# Patient Record
Sex: Female | Born: 1975 | Race: Black or African American | Hispanic: No | Marital: Married | Smoking: Never smoker
Health system: Southern US, Community
[De-identification: ages and names within clinical notes are randomized; demographics above are authoritative.]

## PROBLEM LIST (undated history)

## (undated) DIAGNOSIS — F419 Anxiety disorder, unspecified: Secondary | ICD-10-CM

## (undated) DIAGNOSIS — K589 Irritable bowel syndrome without diarrhea: Secondary | ICD-10-CM

## (undated) DIAGNOSIS — Z8679 Personal history of other diseases of the circulatory system: Secondary | ICD-10-CM

## (undated) DIAGNOSIS — I509 Heart failure, unspecified: Secondary | ICD-10-CM

## (undated) DIAGNOSIS — E039 Hypothyroidism, unspecified: Secondary | ICD-10-CM

## (undated) DIAGNOSIS — E0590 Thyrotoxicosis, unspecified without thyrotoxic crisis or storm: Secondary | ICD-10-CM

## (undated) DIAGNOSIS — R87619 Unspecified abnormal cytological findings in specimens from cervix uteri: Secondary | ICD-10-CM

## (undated) DIAGNOSIS — D649 Anemia, unspecified: Secondary | ICD-10-CM

## (undated) DIAGNOSIS — IMO0002 Reserved for concepts with insufficient information to code with codable children: Secondary | ICD-10-CM

## (undated) DIAGNOSIS — D573 Sickle-cell trait: Secondary | ICD-10-CM

## (undated) HISTORY — PX: DILATION AND CURETTAGE OF UTERUS: SHX78

## (undated) HISTORY — DX: Heart failure, unspecified: I50.9

## (undated) HISTORY — DX: Anemia, unspecified: D64.9

## (undated) HISTORY — DX: Irritable bowel syndrome, unspecified: K58.9

## (undated) HISTORY — DX: Hypothyroidism, unspecified: E03.9

## (undated) HISTORY — DX: Unspecified abnormal cytological findings in specimens from cervix uteri: R87.619

## (undated) HISTORY — DX: Personal history of other diseases of the circulatory system: Z86.79

## (undated) HISTORY — DX: Thyrotoxicosis, unspecified without thyrotoxic crisis or storm: E05.90

## (undated) HISTORY — DX: Anxiety disorder, unspecified: F41.9

## (undated) HISTORY — DX: Sickle-cell trait: D57.3

## (undated) HISTORY — DX: Reserved for concepts with insufficient information to code with codable children: IMO0002

## (undated) HISTORY — PX: GYNECOLOGIC CRYOSURGERY: SHX857

---

## 1998-09-04 ENCOUNTER — Ambulatory Visit (HOSPITAL_COMMUNITY): Admission: RE | Admit: 1998-09-04 | Discharge: 1998-09-04 | Payer: Self-pay | Admitting: Obstetrics and Gynecology

## 1998-09-04 ENCOUNTER — Encounter: Payer: Self-pay | Admitting: Obstetrics and Gynecology

## 1999-01-21 ENCOUNTER — Inpatient Hospital Stay (HOSPITAL_COMMUNITY): Admission: AD | Admit: 1999-01-21 | Discharge: 1999-01-23 | Payer: Self-pay | Admitting: *Deleted

## 1999-02-27 ENCOUNTER — Encounter: Admission: RE | Admit: 1999-02-27 | Discharge: 1999-05-28 | Payer: Self-pay | Admitting: Obstetrics and Gynecology

## 1999-06-27 ENCOUNTER — Encounter: Admission: RE | Admit: 1999-06-27 | Discharge: 1999-09-25 | Payer: Self-pay | Admitting: Obstetrics and Gynecology

## 1999-09-27 ENCOUNTER — Encounter: Admission: RE | Admit: 1999-09-27 | Discharge: 1999-12-26 | Payer: Self-pay | Admitting: Obstetrics and Gynecology

## 1999-12-27 ENCOUNTER — Encounter: Admission: RE | Admit: 1999-12-27 | Discharge: 2000-03-26 | Payer: Self-pay | Admitting: Obstetrics and Gynecology

## 2000-04-26 ENCOUNTER — Encounter: Admission: RE | Admit: 2000-04-26 | Discharge: 2000-05-26 | Payer: Self-pay | Admitting: Obstetrics and Gynecology

## 2000-06-26 ENCOUNTER — Encounter: Admission: RE | Admit: 2000-06-26 | Discharge: 2000-07-26 | Payer: Self-pay | Admitting: Obstetrics and Gynecology

## 2000-07-26 ENCOUNTER — Other Ambulatory Visit: Admission: RE | Admit: 2000-07-26 | Discharge: 2000-07-26 | Payer: Self-pay | Admitting: Obstetrics and Gynecology

## 2000-08-26 ENCOUNTER — Encounter: Admission: RE | Admit: 2000-08-26 | Discharge: 2000-09-25 | Payer: Self-pay | Admitting: Obstetrics and Gynecology

## 2001-07-27 ENCOUNTER — Other Ambulatory Visit: Admission: RE | Admit: 2001-07-27 | Discharge: 2001-07-27 | Payer: Self-pay | Admitting: Obstetrics and Gynecology

## 2002-08-06 ENCOUNTER — Other Ambulatory Visit: Admission: RE | Admit: 2002-08-06 | Discharge: 2002-08-06 | Payer: Self-pay | Admitting: Obstetrics and Gynecology

## 2004-03-20 ENCOUNTER — Other Ambulatory Visit: Admission: RE | Admit: 2004-03-20 | Discharge: 2004-03-20 | Payer: Self-pay | Admitting: Obstetrics and Gynecology

## 2008-06-19 ENCOUNTER — Ambulatory Visit (HOSPITAL_COMMUNITY): Admission: RE | Admit: 2008-06-19 | Discharge: 2008-06-19 | Payer: Self-pay | Admitting: Obstetrics and Gynecology

## 2008-06-19 ENCOUNTER — Encounter (INDEPENDENT_AMBULATORY_CARE_PROVIDER_SITE_OTHER): Payer: Self-pay | Admitting: Obstetrics and Gynecology

## 2008-07-09 IMAGING — CT CT-UROGRAM
2 series · 16 of 42 positions shown, 19 images · non-contrast
Comparison: NONE

CLINICAL DATA: Hematuria.  Right-sided pain.  Evaluate for 
kidney  stones. 

CT UROGRAM
TECHNIQUE: Thin-section unenhanced axial images were obtained to 
provide a CT urogram to evaluate for possible urinary tract stone. 
 Scan thicknesses were 3 mm with 3-mm increments.

[Series 2: wo · axial · 0.68mm/px · z∈[+1295,+1643]mm · 13 of 130 slices shown, 16 images]
[im 9/130  soft-tissue]
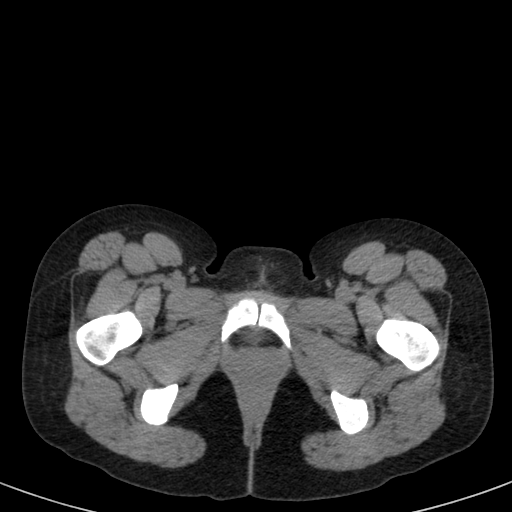
[im 9/130  bone]
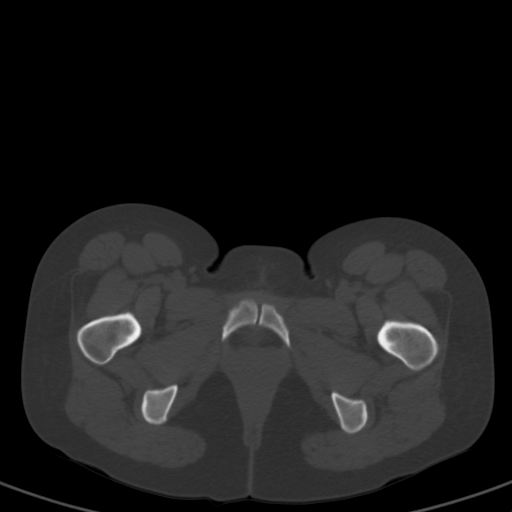
[im 21/130  soft-tissue]
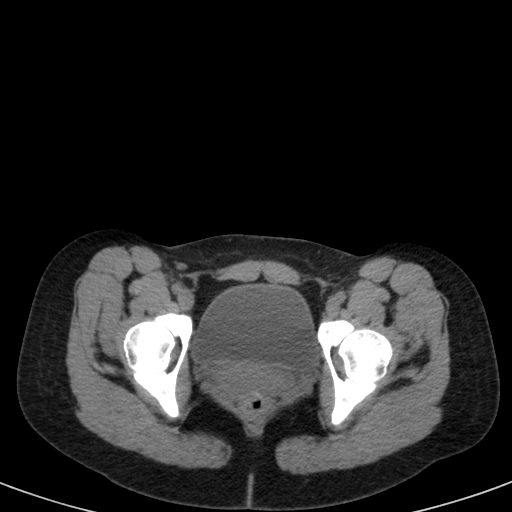
[im 34/130  soft-tissue]
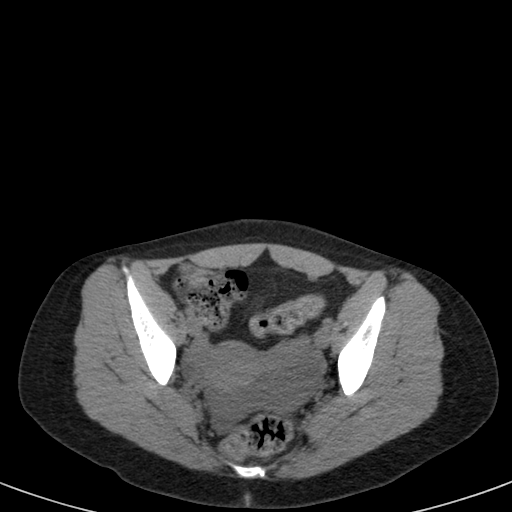
[im 46/130  soft-tissue]
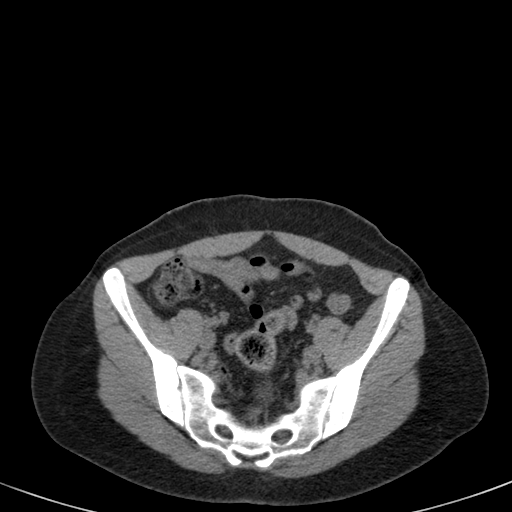
[im 59/130  soft-tissue]
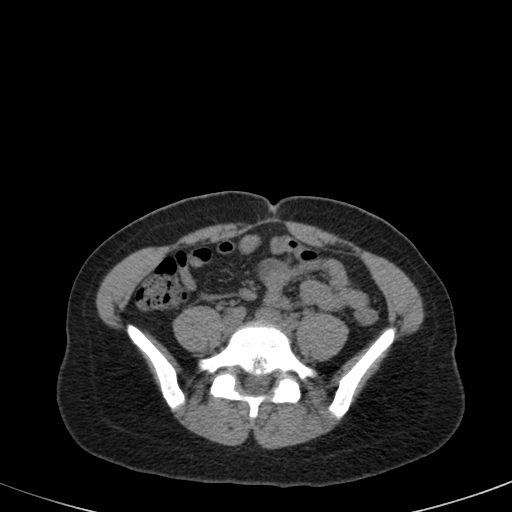
[im 71/130  soft-tissue]
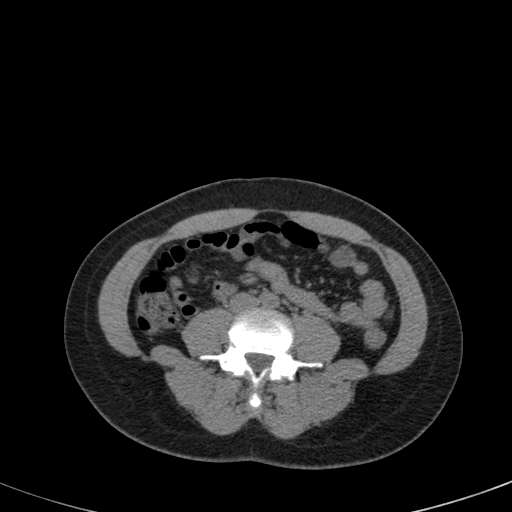
[im 84/130  soft-tissue]
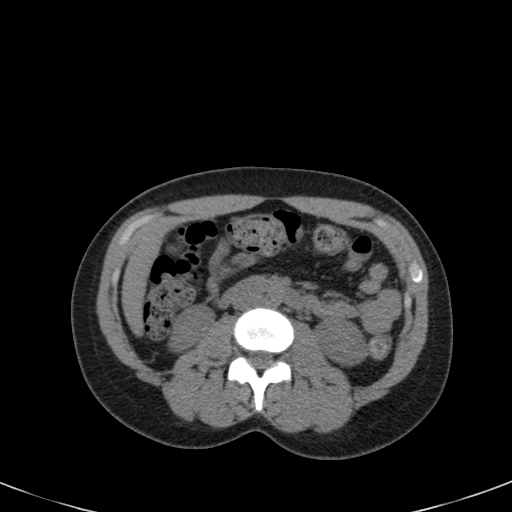
[im 96/130  soft-tissue]
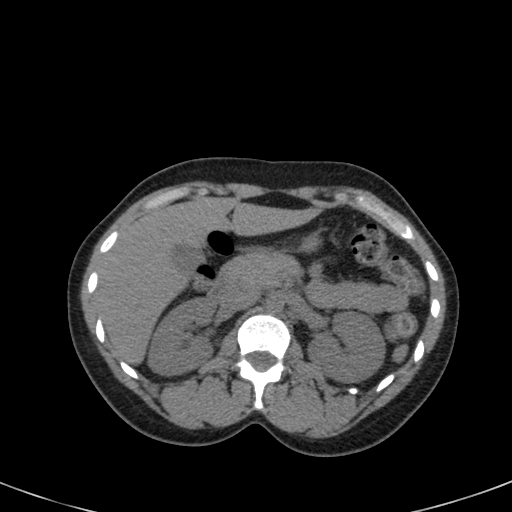
[im 109/130  soft-tissue]
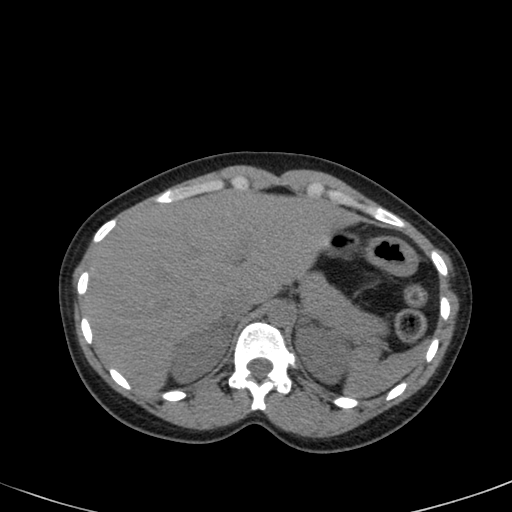
[im 109/130  bone]
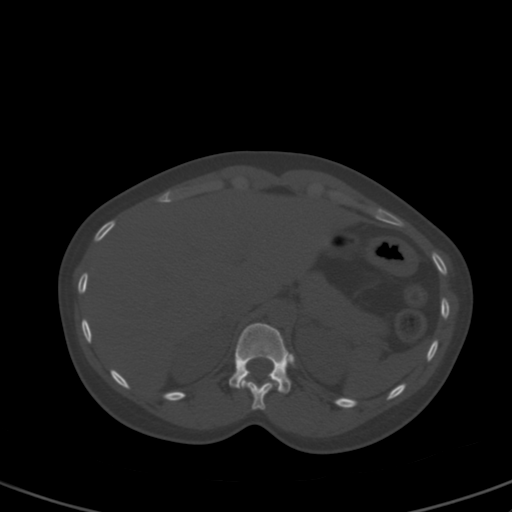
[im 113/130  lung]
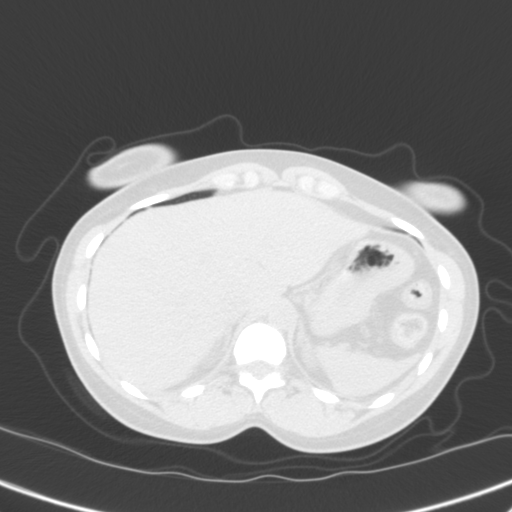
[im 117/130  lung]
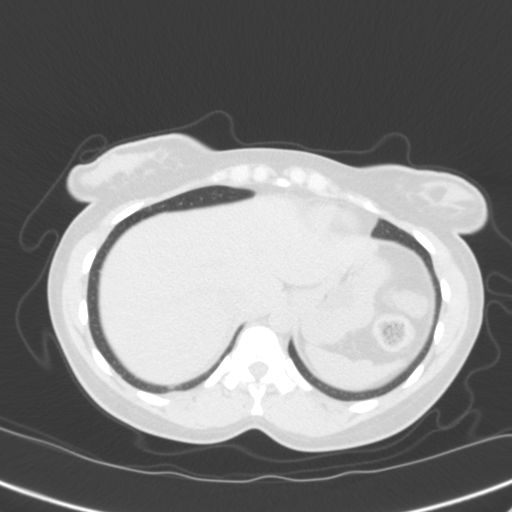
[im 121/130  soft-tissue]
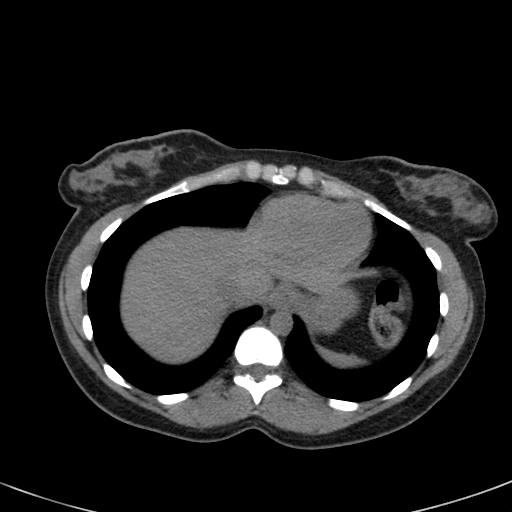
[im 121/130  lung]
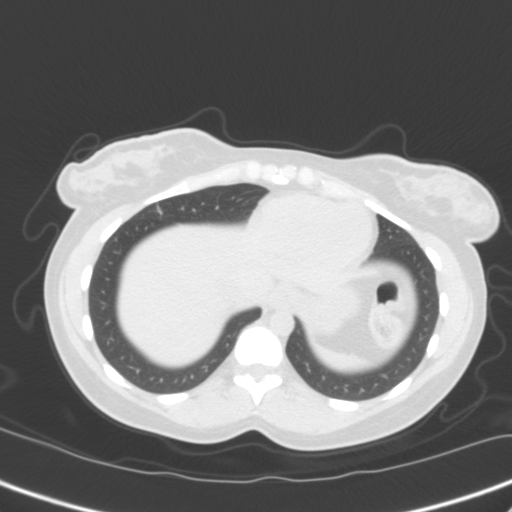
[im 125/130  lung]
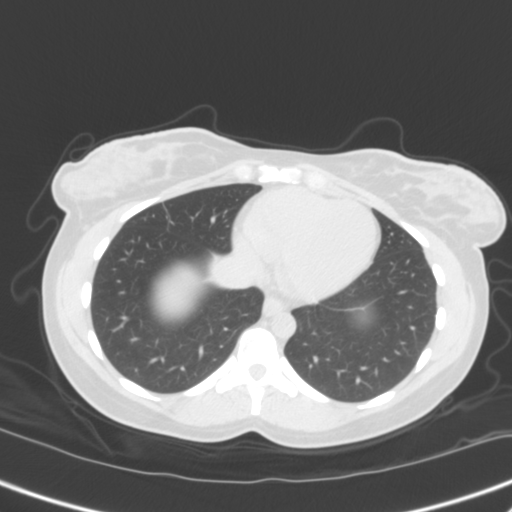

[coronals · coronal · 0.75mm/px · 3 of 72 slices shown]
[im 24/72  soft-tissue]
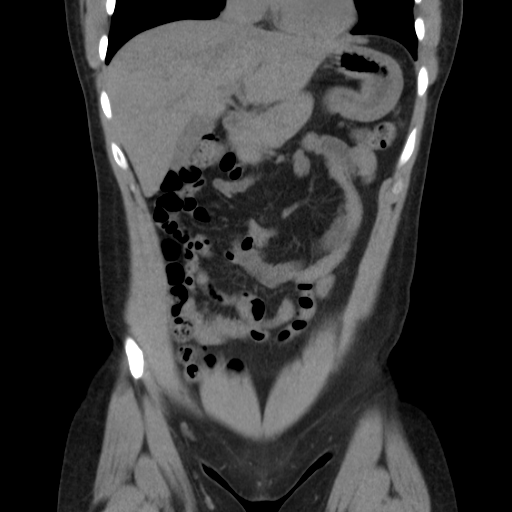
[im 32/72  soft-tissue]
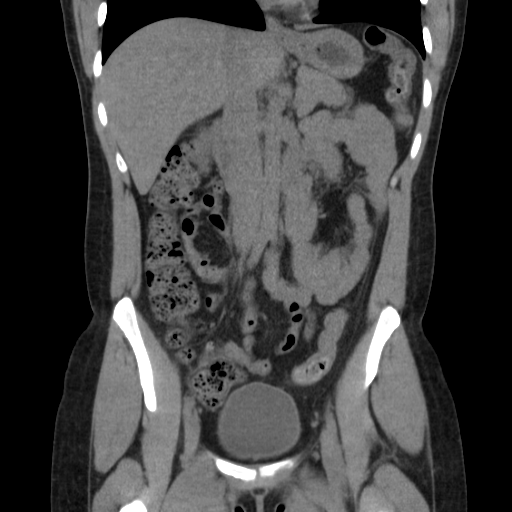
[im 40/72  soft-tissue]
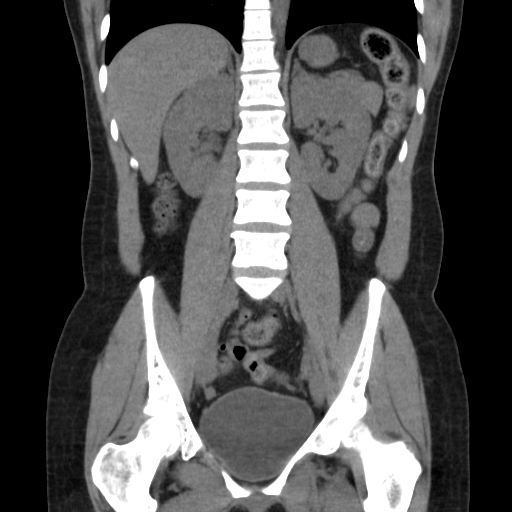

[16 of 42 positions shown; findings below may reference images not displayed]

FINDINGS: The study demonstrates that there are several 
calcifications seen in the right hemipelvis.  The inferior-most 
1-mm calcification seen on image [DATE] represent a distal right 
ureteral stone or may represent an adnexal calcification or 
phlebolith.  No dilation of the right ureter or pelvicaliceal 
system is currently evident.  No kidney stone is seen on the left. 
 No dilation is seen on the left.  No stone is seen along the 
course of the left ureter. In the pelvis, there is noted to be 
some peritoneal fluid or cul-de-sac fluid.  There are also rounded 
fluid-type densities measuring 2.0 to 3.0 cm in size in the right 
and left hemipelvis, which may represent cysts in association with 
the right and left ovaries  These are in the expected location of 
the ovaries given the position of the uterus just to the right of 
midline.  Uterus appears to be normal in size. Cecum is normal in 
appearance.  Appendix is normal in appearance containing air 
within the lumen. The liver, spleen, gallbladder, pancreas, 
kidneys, and aorta are unremarkable.  No focal bowel wall 
thickening is seen.
IMPRESSION: Although no dilation is seen in the right 
pelvicaliceal system or ureter, I cannot exclude the possibility 
of a nonobstructing or low-grade partially obstructing distal 
right ureteral stone measuring approximately 1.0 mm in size in the 
distal right ureter.  This cannot be differentiated from a 
phlebolith given its position. A small amount of fluid is seen in 
the cul-de-sac. There are likely to be bilateral ovarian cysts 
versus hydrosalpinx or pyosalpinx.  Consider evaluation of the 
pelvis with pelvic ultrasound for better delineation of the 
adnexal findings lying adjacent to a normal-appearing uterus. 
12/21/2005  Trans Date: 12/21/2005 [REDACTED]

## 2008-07-10 IMAGING — US US PELVIS COMPLETE
1 series · 13 of 25 positions shown · non-contrast
Comparison: NONE

CLINICAL DATA: Follow-up CT dated 12/21/05 

PELVIC ULTRASOUND

[Series 1: us pelvic · 0.28mm/px · 13 of 50 slices shown]
[im 1/50]
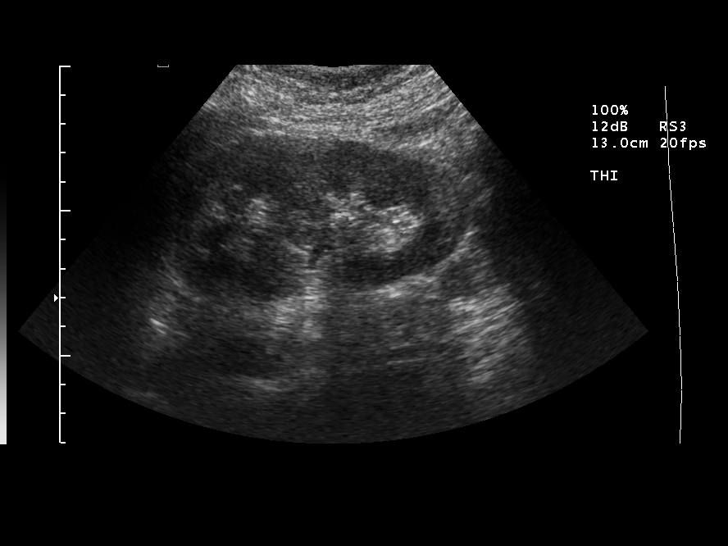
[im 5/50]
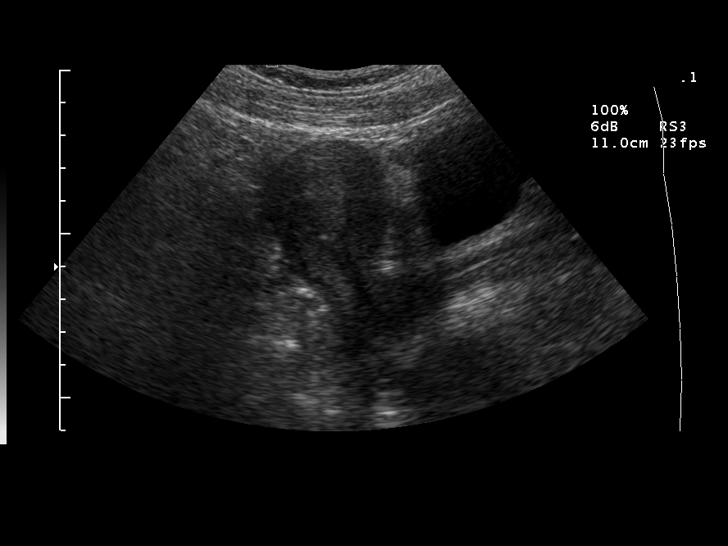
[im 9/50]
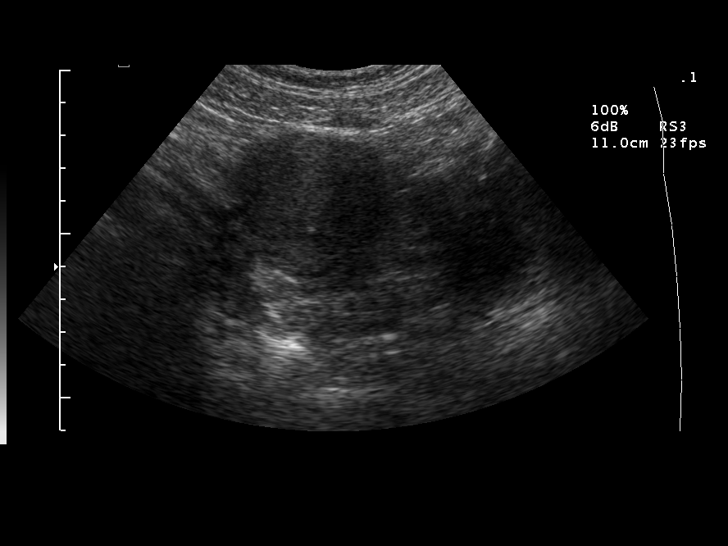
[im 13/50]
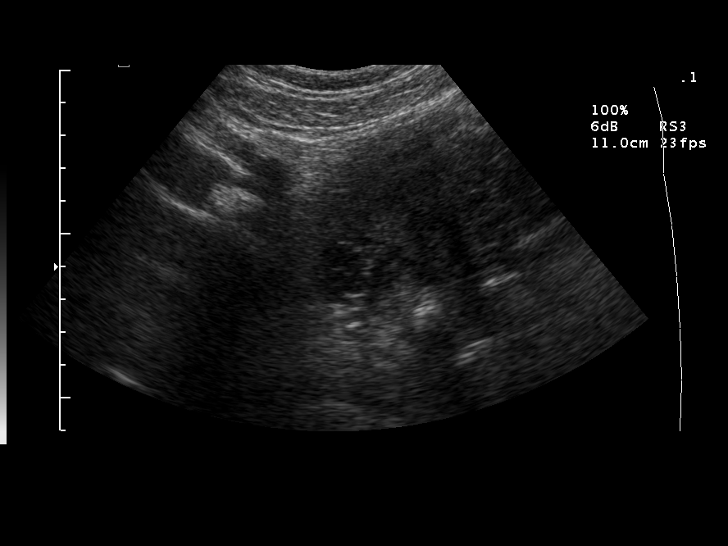
[im 17/50]
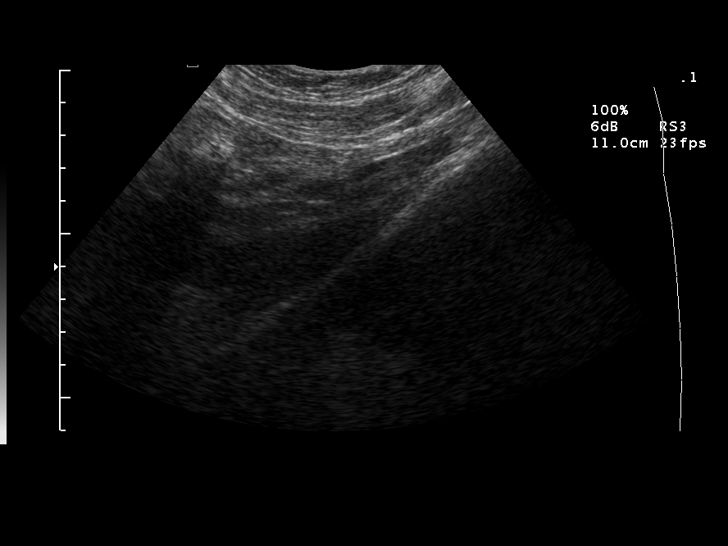
[im 21/50]
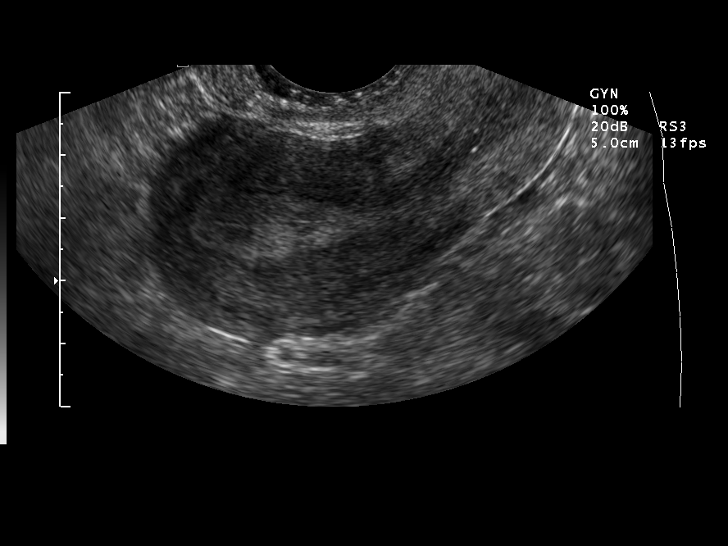
[im 25/50]
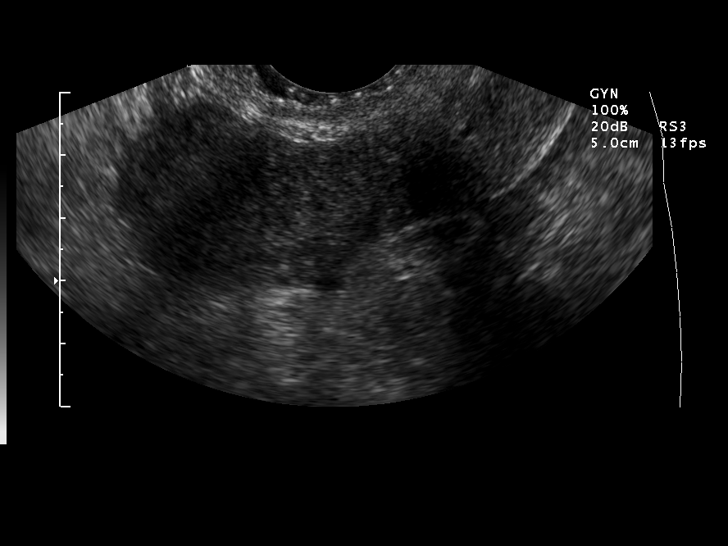
[im 29/50]
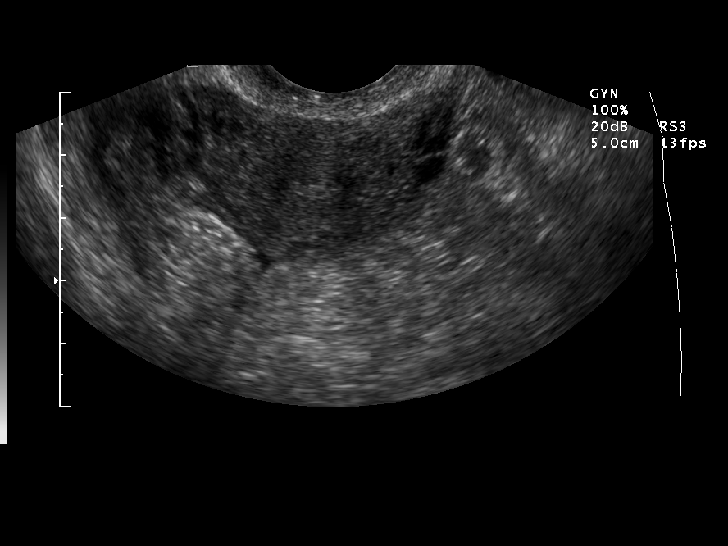
[im 33/50]
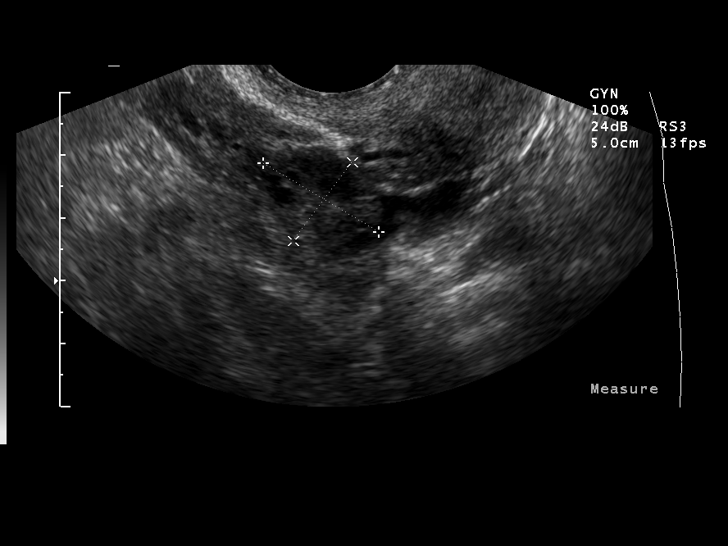
[im 37/50]
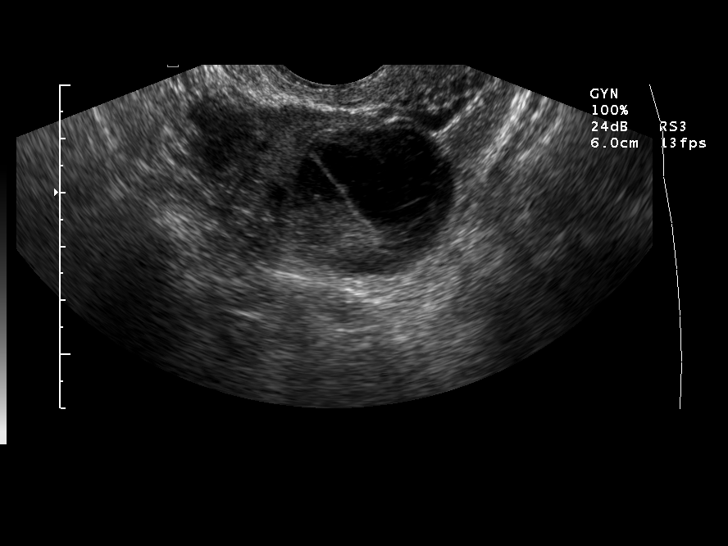
[im 41/50]
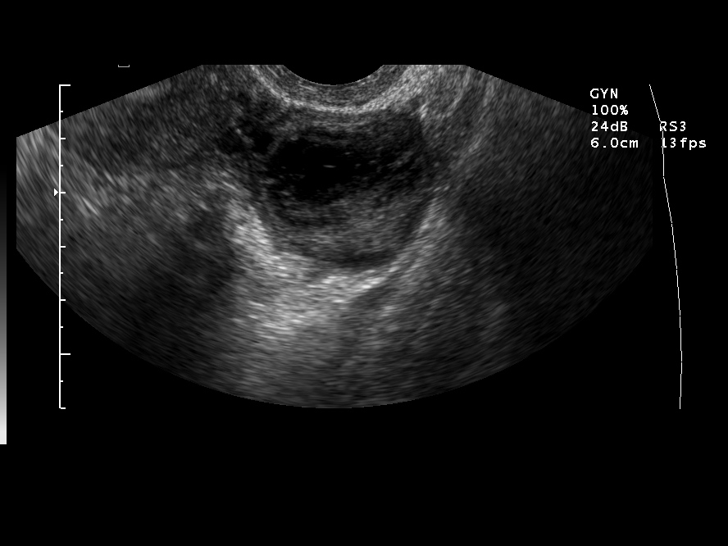
[im 45/50]
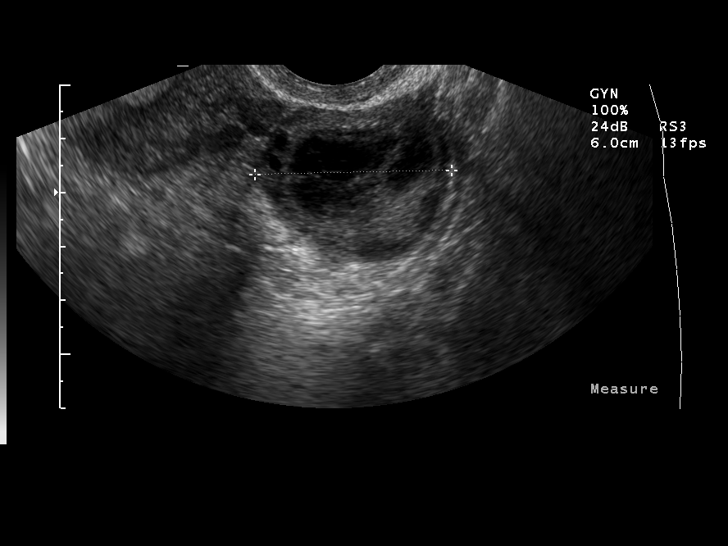
[im 50/50]
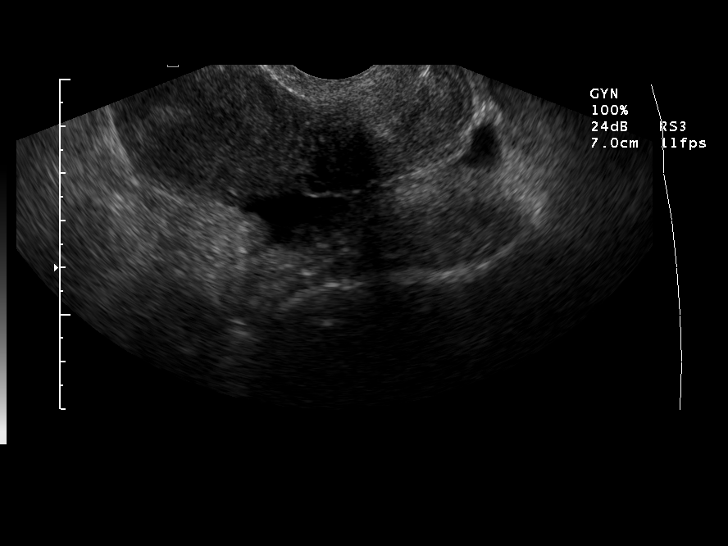

[13 of 25 positions shown; findings below may reference images not displayed]

FINDINGS: Recent CT was reviewed. This demonstrated possible 
bilateral adnexal cysts. Transabdominal and transvaginal images 
were performed. The kidneys are normal in size and echo appearance 
bilaterally. The uterus is normal in size and echo appearance with 
sagittal, AP and transverse dimensions of 7.0 x 4.0 x 4.8 cm. The 
endometrial stripe is normal in thickness and echo appearance 
measuring 1.1 cm. The right ovary is normal in size and echo 
appearance measuring 2.1 x 2.1 x 1.6 cm. The left ovary measures 
5.0 x 3.1 x 3.7 cm. Within the left ovary is a complex cystic and 
solid nodule measuring 3.0 cm. Appearances favor hemorrhagic cyst. 
A small amount of fluid is demonstrated within the cul-de-sac. 
There are no dilated tubular structures present.
IMPRESSION: 3 cm complex cyst in the left ovary with appearances 
favoring hemorrhagic cyst. To exclude the possibility of a 
neoplastic process, I recommend follow-up ultrasound in 4-6 weeks. 
Normal appearances of the right ovary. Small amount of fluid noted 
on 12/24/2005 Dict Date: 12/24/2005  Trans Date: 12/24/2005 CAV 
[REDACTED]

## 2009-03-31 ENCOUNTER — Ambulatory Visit (HOSPITAL_COMMUNITY): Admission: RE | Admit: 2009-03-31 | Discharge: 2009-03-31 | Payer: Self-pay | Admitting: Obstetrics and Gynecology

## 2009-05-23 ENCOUNTER — Inpatient Hospital Stay (HOSPITAL_COMMUNITY)
Admission: AD | Admit: 2009-05-23 | Discharge: 2009-06-18 | Payer: Self-pay | Source: Home / Self Care | Admitting: Obstetrics and Gynecology

## 2009-05-23 ENCOUNTER — Ambulatory Visit: Payer: Self-pay | Admitting: Internal Medicine

## 2009-05-23 ENCOUNTER — Encounter: Payer: Self-pay | Admitting: Obstetrics and Gynecology

## 2009-05-26 ENCOUNTER — Encounter: Payer: Self-pay | Admitting: Obstetrics and Gynecology

## 2009-05-27 ENCOUNTER — Encounter: Payer: Self-pay | Admitting: Obstetrics and Gynecology

## 2009-05-29 ENCOUNTER — Encounter: Payer: Self-pay | Admitting: Obstetrics and Gynecology

## 2009-05-30 ENCOUNTER — Encounter: Payer: Self-pay | Admitting: Obstetrics and Gynecology

## 2009-06-03 ENCOUNTER — Encounter: Payer: Self-pay | Admitting: Obstetrics and Gynecology

## 2009-06-04 ENCOUNTER — Encounter: Payer: Self-pay | Admitting: Obstetrics and Gynecology

## 2009-06-05 ENCOUNTER — Encounter: Payer: Self-pay | Admitting: Obstetrics and Gynecology

## 2009-06-06 ENCOUNTER — Encounter: Payer: Self-pay | Admitting: Obstetrics and Gynecology

## 2009-06-11 ENCOUNTER — Encounter: Payer: Self-pay | Admitting: Obstetrics and Gynecology

## 2009-06-12 ENCOUNTER — Encounter: Payer: Self-pay | Admitting: Obstetrics and Gynecology

## 2009-06-13 ENCOUNTER — Encounter: Payer: Self-pay | Admitting: Obstetrics and Gynecology

## 2009-06-15 ENCOUNTER — Encounter: Payer: Self-pay | Admitting: Internal Medicine

## 2009-06-18 ENCOUNTER — Encounter: Admission: RE | Admit: 2009-06-18 | Discharge: 2009-07-18 | Payer: Self-pay | Admitting: Obstetrics and Gynecology

## 2009-06-25 ENCOUNTER — Ambulatory Visit (HOSPITAL_COMMUNITY): Admission: AD | Admit: 2009-06-25 | Discharge: 2009-06-25 | Payer: Self-pay | Admitting: Neonatology

## 2009-07-19 ENCOUNTER — Encounter: Admission: RE | Admit: 2009-07-19 | Discharge: 2009-08-18 | Payer: Self-pay | Admitting: Obstetrics and Gynecology

## 2009-08-19 ENCOUNTER — Encounter: Admission: RE | Admit: 2009-08-19 | Discharge: 2009-09-18 | Payer: Self-pay | Admitting: Obstetrics and Gynecology

## 2009-09-19 ENCOUNTER — Encounter: Admission: RE | Admit: 2009-09-19 | Discharge: 2009-09-24 | Payer: Self-pay | Admitting: Obstetrics and Gynecology

## 2009-10-20 ENCOUNTER — Encounter: Admission: RE | Admit: 2009-10-20 | Discharge: 2009-11-19 | Payer: Self-pay | Admitting: Obstetrics and Gynecology

## 2009-11-20 ENCOUNTER — Encounter
Admission: RE | Admit: 2009-11-20 | Discharge: 2009-12-20 | Payer: Self-pay | Source: Home / Self Care | Attending: Obstetrics and Gynecology | Admitting: Obstetrics and Gynecology

## 2009-12-21 ENCOUNTER — Encounter
Admission: RE | Admit: 2009-12-21 | Discharge: 2010-01-20 | Payer: Self-pay | Source: Home / Self Care | Attending: Obstetrics and Gynecology | Admitting: Obstetrics and Gynecology

## 2010-01-21 ENCOUNTER — Encounter
Admission: RE | Admit: 2010-01-21 | Discharge: 2010-02-03 | Payer: Self-pay | Source: Home / Self Care | Attending: Obstetrics and Gynecology | Admitting: Obstetrics and Gynecology

## 2010-01-25 ENCOUNTER — Encounter: Payer: Self-pay | Admitting: Obstetrics and Gynecology

## 2010-01-25 ENCOUNTER — Encounter: Payer: Self-pay | Admitting: *Deleted

## 2010-01-28 ENCOUNTER — Encounter (HOSPITAL_COMMUNITY)
Admission: RE | Admit: 2010-01-28 | Discharge: 2010-02-03 | Payer: Self-pay | Source: Home / Self Care | Attending: Endocrinology | Admitting: Endocrinology

## 2010-02-11 ENCOUNTER — Other Ambulatory Visit: Payer: Self-pay | Admitting: Endocrinology

## 2010-02-11 DIAGNOSIS — E0590 Thyrotoxicosis, unspecified without thyrotoxic crisis or storm: Secondary | ICD-10-CM

## 2010-02-21 ENCOUNTER — Encounter (HOSPITAL_COMMUNITY)
Admission: RE | Admit: 2010-02-21 | Discharge: 2010-02-21 | Disposition: A | Discharge status: Home / Self Care | Payer: Managed Care, Other (non HMO) | Source: Ambulatory Visit | Attending: Obstetrics and Gynecology | Admitting: Obstetrics and Gynecology

## 2010-02-21 DIAGNOSIS — O923 Agalactia: Secondary | ICD-10-CM | POA: Insufficient documentation

## 2010-02-23 ENCOUNTER — Ambulatory Visit (HOSPITAL_COMMUNITY)
Admission: RE | Admit: 2010-02-23 | Discharge: 2010-02-23 | Disposition: A | Discharge status: Home / Self Care | Source: Ambulatory Visit | Attending: Endocrinology | Admitting: Endocrinology

## 2010-02-23 DIAGNOSIS — E0590 Thyrotoxicosis, unspecified without thyrotoxic crisis or storm: Secondary | ICD-10-CM | POA: Insufficient documentation

## 2010-02-23 LAB — HCG, SERUM, QUALITATIVE: Preg, Serum: NEGATIVE

## 2010-02-23 MED ORDER — SODIUM IODIDE I 131 CAPSULE
43.5000 | Freq: Once | INTRAVENOUS | Status: AC | PRN
  Administered 2010-02-23: 43.5 via ORAL

## 2010-03-11 ENCOUNTER — Emergency Department (HOSPITAL_COMMUNITY)
Admission: EM | Admit: 2010-03-11 | Discharge: 2010-03-12 | Disposition: A | Discharge status: Home / Self Care | Payer: Managed Care, Other (non HMO) | Attending: Emergency Medicine | Admitting: Emergency Medicine

## 2010-03-11 ENCOUNTER — Inpatient Hospital Stay (INDEPENDENT_AMBULATORY_CARE_PROVIDER_SITE_OTHER)
Admission: RE | Admit: 2010-03-11 | Discharge: 2010-03-11 | Disposition: A | Discharge status: Home / Self Care | Source: Ambulatory Visit | Attending: Emergency Medicine | Admitting: Emergency Medicine

## 2010-03-11 DIAGNOSIS — R61 Generalized hyperhidrosis: Secondary | ICD-10-CM | POA: Insufficient documentation

## 2010-03-11 DIAGNOSIS — R5381 Other malaise: Secondary | ICD-10-CM | POA: Insufficient documentation

## 2010-03-11 DIAGNOSIS — R51 Headache: Secondary | ICD-10-CM | POA: Insufficient documentation

## 2010-03-11 DIAGNOSIS — H53149 Visual discomfort, unspecified: Secondary | ICD-10-CM | POA: Insufficient documentation

## 2010-03-11 DIAGNOSIS — R110 Nausea: Secondary | ICD-10-CM | POA: Insufficient documentation

## 2010-03-11 DIAGNOSIS — R5383 Other fatigue: Secondary | ICD-10-CM | POA: Insufficient documentation

## 2010-03-11 DIAGNOSIS — Z79899 Other long term (current) drug therapy: Secondary | ICD-10-CM | POA: Insufficient documentation

## 2010-03-11 DIAGNOSIS — R42 Dizziness and giddiness: Secondary | ICD-10-CM | POA: Insufficient documentation

## 2010-03-12 ENCOUNTER — Emergency Department (HOSPITAL_COMMUNITY): Payer: Managed Care, Other (non HMO)

## 2010-03-12 LAB — URINE MICROSCOPIC-ADD ON

## 2010-03-12 LAB — URINALYSIS, ROUTINE W REFLEX MICROSCOPIC
Ketones, ur: NEGATIVE mg/dL
Protein, ur: NEGATIVE mg/dL
Urobilinogen, UA: 0.2 mg/dL (ref 0.0–1.0)

## 2010-03-12 LAB — POCT I-STAT, CHEM 8
BUN: 10 mg/dL (ref 6–23)
Calcium, Ion: 0.87 mmol/L — ABNORMAL LOW (ref 1.12–1.32)
Chloride: 109 mEq/L (ref 96–112)
TCO2: 23 mmol/L (ref 0–100)

## 2010-03-12 LAB — POCT PREGNANCY, URINE: Preg Test, Ur: NEGATIVE

## 2010-03-23 LAB — BASIC METABOLIC PANEL
Calcium: 8.2 mg/dL — ABNORMAL LOW (ref 8.4–10.5)
Calcium: 8.5 mg/dL (ref 8.4–10.5)
Chloride: 105 mEq/L (ref 96–112)
GFR calc Af Amer: >60 mL/min (ref >60)
GFR calc Af Amer: >60 mL/min (ref >60)
GFR calc non Af Amer: >60 mL/min (ref >60)
GFR calc non Af Amer: >60 mL/min (ref >60)
Glucose, Bld: 119 mg/dL — ABNORMAL HIGH (ref 70–99)
Potassium: 4.1 mEq/L (ref 3.5–5.1)
Potassium: 4.2 mEq/L (ref 3.5–5.1)
Potassium: 4.2 mEq/L (ref 3.5–5.1)

## 2010-03-23 LAB — TORCH-IGM(TOXO/ RUB/ CMV/ HSV) W TITER
CMV IgM: 0.21 Index (ref <0.90)
HSV IgM Ab SCREEN: NOT DETECTED
Toxoplasma IgM: NEGATIVE

## 2010-03-23 LAB — CBC
HCT: 22.3 % — ABNORMAL LOW (ref 36.0–46.0)
HCT: 27.9 % — ABNORMAL LOW (ref 36.0–46.0)
HCT: 31.2 % — ABNORMAL LOW (ref 36.0–46.0)
Hemoglobin: 7.9 g/dL — ABNORMAL LOW (ref 12.0–15.0)
Hemoglobin: 7.9 g/dL — ABNORMAL LOW (ref 12.0–15.0)
Hemoglobin: 9.5 g/dL — ABNORMAL LOW (ref 12.0–15.0)
MCHC: 33.9 g/dL (ref 30.0–36.0)
MCHC: 35.2 g/dL (ref 30.0–36.0)
MCV: 105.4 fL — ABNORMAL HIGH (ref 78.0–100.0)
MCV: 97.9 fL (ref 78.0–100.0)
MCV: 98.5 fL (ref 78.0–100.0)
MCV: 98.8 fL (ref 78.0–100.0)
Platelets: 351 10*3/uL (ref 150–400)
Platelets: 726 10*3/uL — ABNORMAL HIGH (ref 150–400)
RBC: 2.37 MIL/uL — ABNORMAL LOW (ref 3.87–5.11)
RBC: 3.16 MIL/uL — ABNORMAL LOW (ref 3.87–5.11)
RDW: 15.4 % (ref 11.5–15.5)
WBC: 10.4 10*3/uL (ref 4.0–10.5)
WBC: 11.8 10*3/uL — ABNORMAL HIGH (ref 4.0–10.5)
WBC: 7.5 10*3/uL (ref 4.0–10.5)

## 2010-03-23 LAB — TSH: TSH: 0.006 u[IU]/mL — ABNORMAL LOW (ref 0.350–4.500)

## 2010-03-23 LAB — BRAIN NATRIURETIC PEPTIDE: Pro B Natriuretic peptide (BNP): 102 pg/mL — ABNORMAL HIGH (ref 0.0–100.0)

## 2010-03-23 LAB — MISCELLANEOUS TEST

## 2010-03-24 ENCOUNTER — Encounter (HOSPITAL_COMMUNITY)
Admission: RE | Admit: 2010-03-24 | Discharge: 2010-03-24 | Disposition: A | Discharge status: Home / Self Care | Payer: Managed Care, Other (non HMO) | Source: Ambulatory Visit | Attending: Obstetrics and Gynecology | Admitting: Obstetrics and Gynecology

## 2010-03-24 DIAGNOSIS — O923 Agalactia: Secondary | ICD-10-CM | POA: Insufficient documentation

## 2010-04-13 LAB — CBC
HCT: 34.4 % — ABNORMAL LOW (ref 36.0–46.0)
MCV: 99.9 fL (ref 78.0–100.0)
RBC: 3.45 MIL/uL — ABNORMAL LOW (ref 3.87–5.11)
WBC: 7.5 10*3/uL (ref 4.0–10.5)

## 2010-04-13 LAB — ABO/RH: ABO/RH(D): O POS

## 2010-04-24 ENCOUNTER — Encounter (HOSPITAL_COMMUNITY)
Admission: RE | Admit: 2010-04-24 | Discharge: 2010-04-24 | Disposition: A | Discharge status: Home / Self Care | Payer: Managed Care, Other (non HMO) | Source: Ambulatory Visit | Attending: Obstetrics and Gynecology | Admitting: Obstetrics and Gynecology

## 2010-04-24 DIAGNOSIS — O923 Agalactia: Secondary | ICD-10-CM | POA: Insufficient documentation

## 2010-05-19 NOTE — H&P (Signed)
[name], [name]              ACCOUNT NO.:  [id number]   MEDICAL RECORD NO.:  [id number]           PATIENT TYPE:   LOCATION:                                 FACILITY:   PHYSICIAN:  Sherron Monday, MD        DATE OF BIRTH:  Jul 15, 1975   DATE OF ADMISSION:  06/19/2008  DATE OF DISCHARGE:                              HISTORY & PHYSICAL   ADMISSION DIAGNOSIS:  Missed abortion.   PROCEDURE PLANNED:  Suction D and E.   HISTORY OF PRESENT ILLNESS:  A 35 year old G2 P1-0-0-1 at 61 and 3 weeks  by early ultrasound who presents for nuchal thickness and at this time  is noted that there was body edema and no fetal heart tones.  She states  she has had no bleeding or cramping.   PAST MEDICAL HISTORY:  Not significant.   PAST SURGICAL HISTORY:  Not significant.   PAST OB/GYN HISTORY:  G1 was a term vaginal delivery, 7-year-old female.  G2 is the present pregnancy.  She has a history of an abnormal Pap smear  with cryotherapy.  It has been normal since.  No sexually transmitted  diseases.   MEDICATIONS:  Prenatal vitamins.   ALLERGIES:  NO KNOWN DRUG ALLERGIES.   SOCIAL HISTORY:  Denies alcohol, tobacco or drug use.  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   FAMILY HISTORY:  Significant for diabetes in her father, heart disease  in her father, hypertension in her mother.   PHYSICAL EXAMINATION:  Afebrile, vital signs stable.  Ultrasound as  mentioned previously revealed no fetal heart tones.  GENERAL:  No apparent distress except obviously upset.  CARDIOVASCULAR:  Regular rate and rhythm.  LUNGS:  Clear to auscultation bilaterally.  ABDOMEN:  Soft, nontender, nondistended, positive bowel sounds.  EXTREMITIES:  Symmetric and nontender.   ASSESSMENT AND PLAN:  A 35 year old G2 P1 at 11 weeks with missed  abortion measuring 10 weeks in size.  At this visit we discussed with  the patient various options including expectant management, cytotec  induction or a  dilation  and evacuation.  After discussion of the risks, benefits and  alternatives the patient chooses a dilation and evacuation.  This will  be performed on June 16 so that she can go to [place] with her family on  the 18th.  She voices understanding of all the risks and wishes to  proceed.      Sherron Monday, MD  Electronically Signed     JB/MEDQ  D:  06/18/2008  T:  06/18/2008  Job:  [id number]

## 2010-05-19 NOTE — Op Note (Signed)
[name], [name]              ACCOUNT NO.:  [id number]   MEDICAL RECORD NO.:  [id number]          PATIENT TYPE:  AMB   LOCATION:  [facility]                           FACILITY:  [facility]   PHYSICIAN:  Sherron Monday, MD        DATE OF BIRTH:  01-Jul-1975   DATE OF PROCEDURE:  06/19/2008  DATE OF DISCHARGE:  06/19/2008                               OPERATIVE REPORT   PREOPERATIVE DIAGNOSIS:  Intrauterine pregnancy at 11 weeks with fetal  demise, missed abortion.   POSTOPERATIVE DIAGNOSIS:  Intrauterine pregnancy at 11 weeks with fetal  demise, missed abortion.   PROCEDURE:  Suction, dilatation and evacuation.   SURGEON:  Sherron Monday, MD   ANESTHESIA:  MAC with 15 mL of 2% lidocaine as local.   FINDINGS:  A 10-week size uterus dilated to 30-French with Pratt  dilators and evacuation performed with #10 curette.   SPECIMENS:  Products of conception to Pathology.   ESTIMATED BLOOD LOSS:  Minimal.   IV FLUIDS:  800 mL.   URINE OUTPUT:  I and O cath prior to the procedure.   COMPLICATIONS:  None.   DISPOSITION:  Stable to PACU.   PROCEDURE IN DETAIL:  The patient had been scheduled for an ultrasound  for nuchal thickness at the office and fetal demise was noted with no  fetal cardiac activity and edema of the body.  This was discussed with  the patient.  We discussed various methods of management including  expectant management, Cytotec induction versus a suction, D and E.  The  patient desired definitive management with the D and E.  We discussed  with her risks, benefits and alternatives including bleeding, infection,  damage to her uterus, she voiced understanding and wished to proceed.  She was transported to the operating room, placed on the table in supine  position.  MAC anesthesia was induced and found to be adequate.  She was  then placed in the [place] stirrups, prepped and draped in the normal  sterile fashion.  Her bladder was sterilely drained.  A heavy weighted  speculum  and a Sims retractor were used to easily visualize the cervix  and the paracervical block was placed using the 15 mL of lidocaine.  Cervix was grasped with a single-toothed tenaculum, dilated to 30-French  with Shawnie Pons dilators using a #10 curette.  Several passes of the uterine  cavity were performed evacuating tissue, final pass evacuated no tissue.  Bleeding was found to be within normal limits.  The single-tooth tenaculum was removed and speculum was removed.  The  patient was returned to the supine position, awakened in stable  condition.  Sponge, lap and needle counts were correct x2 at the end of  procedure.  She was awakened and taken to the PACU in stable condition.  She is O positive.       Sherron Monday, MD  Electronically Signed     JB/MEDQ  D:  06/19/2008  T:  06/20/2008  Job:  [id number]

## 2011-01-05 NOTE — L&D Delivery Note (Signed)
Delivery Note At 2:58 PM a healthy female was delivered via Vaginal, Spontaneous Delivery (Presentation: ; Occiput Anterior).  APGAR: 9, 9; weight pending .   Placenta status: Intact, Spontaneous.  Cord: 3 vessels with the following complications: True Knot.  LUS palpated and felt intact. Anesthesia: Epidural  Episiotomy: None Lacerations: 1st degree;Perineal Suture Repair: 3.0 vicryl rapide Est. Blood Loss (mL): 400cc  Mom to postpartum.  Baby to in room with mother.  Oliver Pila 08/18/2011, 3:19 PM

## 2011-01-21 LAB — OB RESULTS CONSOLE ABO/RH: RH Type: POSITIVE

## 2011-01-21 LAB — OB RESULTS CONSOLE RPR: RPR: NONREACTIVE

## 2011-01-21 LAB — OB RESULTS CONSOLE GC/CHLAMYDIA: Chlamydia: NEGATIVE

## 2011-08-05 ENCOUNTER — Telehealth (HOSPITAL_COMMUNITY): Payer: Self-pay | Admitting: *Deleted

## 2011-08-05 ENCOUNTER — Encounter (HOSPITAL_COMMUNITY): Payer: Self-pay | Admitting: *Deleted

## 2011-08-05 NOTE — Telephone Encounter (Signed)
Preadmission screen  

## 2011-08-17 NOTE — H&P (Signed)
[name] is a 36 y.o. female [id number] presenting for IOL and attempted VBAC.  Prenatal care complicated by a prior LTCS at 32 weeks for a baby with non-immune hydrops of unknown etiology.  Baby dies at 34 months of age of multiorgan failure.  No known cause was ever clearly determined.  Pt is also sickle trait carrier. She has hypothyroidism but levels are stable on her meds.  She had a postive antibody screen early in pregnancy that was very weak but had resolved on f/u labs at 28 weeks.  Maternal Medical History:  Contractions: Frequency: irregular.   Perceived severity is mild.    Fetal activity: Perceived fetal activity is normal.      OB History    Grav Para Term Preterm Abortions TAB SAB Ect Mult Living   4 2 1 1 1  1   1     2001 NSVD 7#9oz 2011 LTCS 4#9oz non-immune hydrops and maternal pulmonary edema--baby died at 1 months of age 19 SAB  Past Medical History  Diagnosis Date  . Sickle cell trait   . History of congestive heart failure     with pulm edema, hyperthyroid dx  . Hypothyroidism   . Hyperthyroidism     treated with radioactive iodine now hypothyroid  . IBS (irritable bowel syndrome)   . Abnormal Pap smear    Past Surgical History  Procedure Date  . Cesarean section   . Dilation and curettage of uterus   . Gynecologic cryosurgery    Family History: family history includes Arthritis in her mother; Cancer in her maternal aunt, maternal grandmother, and paternal grandmother; Diabetes in her father; Heart attack in her father; Hypertension in her father and mother; and Sickle cell trait in her brother and mother. Social History:  reports that she has never smoked. She has never used smokeless tobacco. She reports that she does not drink alcohol or use illicit drugs.   Prenatal Transfer Tool  Maternal Diabetes: No Genetic Screening: Normal Maternal Ultrasounds/Referrals: Normal Fetal Ultrasounds or other Referrals:  None Maternal Substance Abuse:   No Significant Maternal Medications:  Meds include: Syntroid Significant Maternal Lab Results:  Lab values include: Other: see prenatal record--sickle trait carrier Other Comments:  prior neonatal loss of baby at 6 months of age with non-immune hydrops  ROS    Last menstrual period 11/14/2010. Maternal Exam:  Uterine Assessment: Contraction strength is mild.  Contraction frequency is irregular.   Abdomen: Patient reports no abdominal tenderness. Fetal presentation: vertex  Introitus: Normal vulva. Normal vagina.    Physical Exam  Constitutional: She is oriented to person, place, and time. She appears well-developed and well-nourished.  Cardiovascular: Normal rate and regular rhythm.   Respiratory: Effort normal and breath sounds normal.  GI: Soft. Bowel sounds are normal.  Genitourinary: Vagina normal and uterus normal.  Musculoskeletal: Normal range of motion.  Neurological: She is alert and oriented to person, place, and time.  Psychiatric: She has a normal mood and affect. Her behavior is normal.    Prenatal labs: ABO, Rh: O/Positive/-- (01/17 0000) Antibody: Positive (01/17 0000), repeat negative Rubella:  Immune RPR: Nonreactive (01/17 0000)  HBsAg: Negative (01/17 0000)  HIV: Non-reactive (01/17 0000)  GBS: Negative (07/26 0000)  One hour GTT 113 First trimester screen and AFP WNL Hgb AS  Assessment/Plan: Pt is a candidate for VBAC with a favorable cervix.  We discussed the risks and benefits of VBAC in detail and she desires to proceed.  We will use pitocin  and AROM.  Oliver Pila 08/17/2011, 8:37 PM

## 2011-08-18 ENCOUNTER — Encounter (HOSPITAL_COMMUNITY): Payer: Self-pay | Admitting: Anesthesiology

## 2011-08-18 ENCOUNTER — Inpatient Hospital Stay (HOSPITAL_COMMUNITY)
Admission: RE | Admit: 2011-08-18 | Discharge: 2011-08-20 | DRG: 775 | Disposition: A | Discharge status: Home / Self Care | Payer: Managed Care, Other (non HMO) | Source: Ambulatory Visit | Attending: Obstetrics and Gynecology | Admitting: Obstetrics and Gynecology

## 2011-08-18 ENCOUNTER — Encounter (HOSPITAL_COMMUNITY): Payer: Self-pay

## 2011-08-18 ENCOUNTER — Inpatient Hospital Stay (HOSPITAL_COMMUNITY): Payer: Managed Care, Other (non HMO) | Admitting: Anesthesiology

## 2011-08-18 VITALS — BP 127/76 | HR 95 | Temp 98.3°F | Resp 18 | Ht 60.0 in | Wt 150.0 lb

## 2011-08-18 DIAGNOSIS — O99284 Endocrine, nutritional and metabolic diseases complicating childbirth: Secondary | ICD-10-CM | POA: Diagnosis present

## 2011-08-18 DIAGNOSIS — O34219 Maternal care for unspecified type scar from previous cesarean delivery: Secondary | ICD-10-CM

## 2011-08-18 DIAGNOSIS — E039 Hypothyroidism, unspecified: Secondary | ICD-10-CM | POA: Diagnosis present

## 2011-08-18 DIAGNOSIS — O9902 Anemia complicating childbirth: Secondary | ICD-10-CM | POA: Diagnosis present

## 2011-08-18 DIAGNOSIS — E079 Disorder of thyroid, unspecified: Secondary | ICD-10-CM | POA: Diagnosis present

## 2011-08-18 DIAGNOSIS — D573 Sickle-cell trait: Secondary | ICD-10-CM | POA: Diagnosis present

## 2011-08-18 LAB — CBC
MCH: 31.1 pg (ref 26.0–34.0)
MCHC: 32.5 g/dL (ref 30.0–36.0)
MCV: 95.6 fL (ref 78.0–100.0)
Platelets: 332 10*3/uL (ref 150–400)
RDW: 14.6 % (ref 11.5–15.5)
WBC: 6.6 10*3/uL (ref 4.0–10.5)

## 2011-08-18 LAB — TYPE AND SCREEN: ABO/RH(D): O POS

## 2011-08-18 MED ORDER — LIDOCAINE HCL (PF) 1 % IJ SOLN: 30.0000 mL | INTRAMUSCULAR | Status: DC | PRN

## 2011-08-18 MED ORDER — OXYTOCIN 40 UNITS IN LACTATED RINGERS INFUSION - SIMPLE MED
1.0000 m[IU]/min | INTRAVENOUS | Status: DC
  Administered 2011-08-18: 2 m[IU]/min via INTRAVENOUS
  Filled 2011-08-18: qty 1000

## 2011-08-18 MED ORDER — PRENATAL MULTIVITAMIN CH
1.0000 | ORAL_TABLET | Freq: Every day | ORAL | Status: DC
  Administered 2011-08-18 – 2011-08-20 (×3): 1 via ORAL
  Filled 2011-08-18 (×4): qty 1

## 2011-08-18 MED ORDER — EPHEDRINE 5 MG/ML INJ: 10.0000 mg | INTRAVENOUS | Status: DC | PRN

## 2011-08-18 MED ORDER — LANOLIN HYDROUS EX OINT: TOPICAL_OINTMENT | CUTANEOUS | Status: DC | PRN

## 2011-08-18 MED ORDER — LEVOTHYROXINE SODIUM 100 MCG PO TABS
100.0000 ug | ORAL_TABLET | Freq: Every day | ORAL | Status: DC
  Administered 2011-08-19 – 2011-08-20 (×2): 100 ug via ORAL
  Filled 2011-08-18 (×2): qty 1

## 2011-08-18 MED ORDER — DIBUCAINE 1 % RE OINT
1.0000 "application " | TOPICAL_OINTMENT | RECTAL | Status: DC | PRN
  Administered 2011-08-18: 1 via RECTAL
  Filled 2011-08-18: qty 28

## 2011-08-18 MED ORDER — ZOLPIDEM TARTRATE 5 MG PO TABS: 5.0000 mg | ORAL_TABLET | Freq: Every evening | ORAL | Status: DC | PRN

## 2011-08-18 MED ORDER — FLEET ENEMA 7-19 GM/118ML RE ENEM: 1.0000 | ENEMA | RECTAL | Status: DC | PRN

## 2011-08-18 MED ORDER — BENZOCAINE-MENTHOL 20-0.5 % EX AERO
1.0000 "application " | INHALATION_SPRAY | CUTANEOUS | Status: DC | PRN
  Administered 2011-08-18 – 2011-08-20 (×2): 1 via TOPICAL
  Filled 2011-08-18 (×2): qty 56

## 2011-08-18 MED ORDER — ONDANSETRON HCL 4 MG/2ML IJ SOLN: 4.0000 mg | INTRAMUSCULAR | Status: DC | PRN

## 2011-08-18 MED ORDER — SIMETHICONE 80 MG PO CHEW: 80.0000 mg | CHEWABLE_TABLET | ORAL | Status: DC | PRN

## 2011-08-18 MED ORDER — DIPHENHYDRAMINE HCL 25 MG PO CAPS: 25.0000 mg | ORAL_CAPSULE | Freq: Four times a day (QID) | ORAL | Status: DC | PRN

## 2011-08-18 MED ORDER — DIPHENHYDRAMINE HCL 50 MG/ML IJ SOLN
12.5000 mg | INTRAMUSCULAR | Status: DC | PRN
  Administered 2011-08-18 (×2): 12.5 mg via INTRAVENOUS
  Filled 2011-08-18: qty 1

## 2011-08-18 MED ORDER — FENTANYL 2.5 MCG/ML BUPIVACAINE 1/10 % EPIDURAL INFUSION (WH - ANES)
14.0000 mL/h | INTRAMUSCULAR | Status: DC
  Filled 2011-08-18: qty 60

## 2011-08-18 MED ORDER — OXYCODONE-ACETAMINOPHEN 5-325 MG PO TABS: 1.0000 | ORAL_TABLET | ORAL | Status: DC | PRN

## 2011-08-18 MED ORDER — OXYTOCIN BOLUS FROM INFUSION
250.0000 mL | Freq: Once | INTRAVENOUS | Status: DC
  Filled 2011-08-18: qty 500

## 2011-08-18 MED ORDER — TETANUS-DIPHTH-ACELL PERTUSSIS 5-2.5-18.5 LF-MCG/0.5 IM SUSP
0.5000 mL | Freq: Once | INTRAMUSCULAR | Status: AC
  Administered 2011-08-19: 0.5 mL via INTRAMUSCULAR
  Filled 2011-08-18: qty 0.5

## 2011-08-18 MED ORDER — LACTATED RINGERS IV SOLN: 500.0000 mL | Freq: Once | INTRAVENOUS | Status: DC

## 2011-08-18 MED ORDER — IBUPROFEN 600 MG PO TABS: 600.0000 mg | ORAL_TABLET | Freq: Four times a day (QID) | ORAL | Status: DC | PRN

## 2011-08-18 MED ORDER — PHENYLEPHRINE 40 MCG/ML (10ML) SYRINGE FOR IV PUSH (FOR BLOOD PRESSURE SUPPORT): 80.0000 ug | PREFILLED_SYRINGE | INTRAVENOUS | Status: DC | PRN

## 2011-08-18 MED ORDER — ONDANSETRON HCL 4 MG PO TABS: 4.0000 mg | ORAL_TABLET | ORAL | Status: DC | PRN

## 2011-08-18 MED ORDER — EPHEDRINE 5 MG/ML INJ
10.0000 mg | INTRAVENOUS | Status: DC | PRN
  Filled 2011-08-18: qty 4

## 2011-08-18 MED ORDER — LACTATED RINGERS IV SOLN: 500.0000 mL | INTRAVENOUS | Status: DC | PRN

## 2011-08-18 MED ORDER — PHENYLEPHRINE 40 MCG/ML (10ML) SYRINGE FOR IV PUSH (FOR BLOOD PRESSURE SUPPORT)
80.0000 ug | PREFILLED_SYRINGE | INTRAVENOUS | Status: DC | PRN
  Filled 2011-08-18: qty 5

## 2011-08-18 MED ORDER — TERBUTALINE SULFATE 1 MG/ML IJ SOLN: 0.2500 mg | Freq: Once | INTRAMUSCULAR | Status: DC | PRN

## 2011-08-18 MED ORDER — WITCH HAZEL-GLYCERIN EX PADS
1.0000 "application " | MEDICATED_PAD | CUTANEOUS | Status: DC | PRN
  Administered 2011-08-18: 1 via TOPICAL

## 2011-08-18 MED ORDER — ACETAMINOPHEN 325 MG PO TABS: 650.0000 mg | ORAL_TABLET | ORAL | Status: DC | PRN

## 2011-08-18 MED ORDER — LIDOCAINE HCL (PF) 1 % IJ SOLN
INTRAMUSCULAR | Status: DC | PRN
  Administered 2011-08-18 (×2): 4 mL

## 2011-08-18 MED ORDER — LACTATED RINGERS IV SOLN
INTRAVENOUS | Status: DC
  Administered 2011-08-18: 07:00:00 via INTRAVENOUS

## 2011-08-18 MED ORDER — FENTANYL 2.5 MCG/ML BUPIVACAINE 1/10 % EPIDURAL INFUSION (WH - ANES)
INTRAMUSCULAR | Status: DC | PRN
  Administered 2011-08-18: 12 mL/h via EPIDURAL

## 2011-08-18 MED ORDER — SENNOSIDES-DOCUSATE SODIUM 8.6-50 MG PO TABS
2.0000 | ORAL_TABLET | Freq: Every day | ORAL | Status: DC
  Administered 2011-08-18 – 2011-08-19 (×2): 2 via ORAL

## 2011-08-18 MED ORDER — ONDANSETRON HCL 4 MG/2ML IJ SOLN: 4.0000 mg | Freq: Four times a day (QID) | INTRAMUSCULAR | Status: DC | PRN

## 2011-08-18 MED ORDER — OXYTOCIN 40 UNITS IN LACTATED RINGERS INFUSION - SIMPLE MED: 62.5000 mL/h | Freq: Once | INTRAVENOUS | Status: DC

## 2011-08-18 MED ORDER — IBUPROFEN 600 MG PO TABS
600.0000 mg | ORAL_TABLET | Freq: Four times a day (QID) | ORAL | Status: DC
  Administered 2011-08-18 – 2011-08-20 (×7): 600 mg via ORAL
  Filled 2011-08-18 (×7): qty 1

## 2011-08-18 MED ORDER — CITRIC ACID-SODIUM CITRATE 334-500 MG/5ML PO SOLN: 30.0000 mL | ORAL | Status: DC | PRN

## 2011-08-18 NOTE — Anesthesia Procedure Notes (Signed)
Epidural Patient location during procedure: OB Start time: 08/18/2011 10:45 AM  Staffing Anesthesiologist: Areej Tayler A. Performed by: anesthesiologist   Preanesthetic Checklist Completed: patient identified, site marked, surgical consent, pre-op evaluation, timeout performed, IV checked, risks and benefits discussed and monitors and equipment checked  Epidural Patient position: sitting Prep: site prepped and draped and DuraPrep Patient monitoring: continuous pulse ox and blood pressure Approach: midline Injection technique: LOR air  Needle:  Needle type: Tuohy  Needle gauge: 17 G Needle length: 9 cm Needle insertion depth: 5 cm cm Catheter type: closed end flexible Catheter size: 19 Gauge Catheter at skin depth: 10 cm Test dose: negative and Other  Assessment Events: blood not aspirated, injection not painful, no injection resistance, negative IV test and no paresthesia  Additional Notes Patient identified. Risks and benefits discussed including failed block, incomplete  Pain control, post dural puncture headache, nerve damage, paralysis, blood pressure Changes, nausea, vomiting, reactions to medications-both toxic and allergic and post Partum back pain. All questions were answered. Patient expressed understanding and wished to proceed. Sterile technique was used throughout procedure. Epidural site was Dressed with sterile barrier dressing. No paresthesias, signs of intravascular injection Or signs of intrathecal spread were encountered.  Patient was more comfortable after the epidural was dosed. Please see RN's note for documentation of vital signs and FHR which are stable.

## 2011-08-18 NOTE — Anesthesia Preprocedure Evaluation (Addendum)
Anesthesia Evaluation  Patient identified by MRN, date of birth, ID band Patient awake    Reviewed: Allergy & Precautions, H&P , Patient's Chart, lab work & pertinent test results  Airway Mallampati: III TM Distance: >3 FB Neck ROM: full    Dental No notable dental hx. (+) Teeth Intact   Pulmonary neg pulmonary ROS,  breath sounds clear to auscultation  Pulmonary exam normal       Cardiovascular +CHF Rhythm:regular Rate:Normal  Hx/o CHF with last pregnancy 2 years ago. Volume overload. Normal EF 65-70%   Neuro/Psych negative neurological ROS  negative psych ROS   GI/Hepatic negative GI ROS, Neg liver ROS, IBS   Endo/Other  Hypothyroidism Hyperthyroidism   Renal/GU negative Renal ROS  negative genitourinary   Musculoskeletal   Abdominal Normal abdominal exam  (+)   Peds  Hematology  (+) Blood dyscrasia, Sickle cell trait ,   Anesthesia Other Findings   Reproductive/Obstetrics (+) Pregnancy                         Anesthesia Physical Anesthesia Plan  ASA: II  Anesthesia Plan: Epidural   Post-op Pain Management:    Induction:   Airway Management Planned:   Additional Equipment:   Intra-op Plan:   Post-operative Plan:   Informed Consent: I have reviewed the patients History and Physical, chart, labs and discussed the procedure including the risks, benefits and alternatives for the proposed anesthesia with the patient or authorized representative who has indicated his/her understanding and acceptance.     Plan Discussed with: Anesthesiologist and Surgeon  Anesthesia Plan Comments:         Anesthesia Quick Evaluation

## 2011-08-18 NOTE — Progress Notes (Signed)
   Subjective: Pt doing well, mild cramping only on pitocin  Objective: BP 118/69  Pulse 95  Temp 98 F (36.7 C) (Oral)  Resp 18  Ht 5' (1.524 m)  Wt 68.04 kg (150 lb)  BMI 29.30 kg/m2  LMP 11/14/2010      FHT:  FHR: 140 bpm, variability: moderate,  accelerations:  Present,  decelerations:  Absent UC:   regular, every 3 minutes SVE:   Dilation: 3.5 Effacement (%): 50 Station: -2 Exam by:: Dr Senaida Ores AROM clear  Labs: Lab Results  Component Value Date   WBC 6.6 08/18/2011   HGB 10.6* 08/18/2011   HCT 32.6* 08/18/2011   MCV 95.6 08/18/2011   PLT 332 08/18/2011    Assessment / Plan: Pt on pitocin for attempted VBAC, plans epidural Vickie Melnik W 08/18/2011, 8:58 AM

## 2011-08-19 LAB — CBC
HCT: 30.4 % — ABNORMAL LOW (ref 36.0–46.0)
Hemoglobin: 9.6 g/dL — ABNORMAL LOW (ref 12.0–15.0)
RBC: 3.17 MIL/uL — ABNORMAL LOW (ref 3.87–5.11)
WBC: 9.2 10*3/uL (ref 4.0–10.5)

## 2011-08-19 NOTE — Anesthesia Postprocedure Evaluation (Signed)
  Anesthesia Post Note  Patient: [name]  Procedure(s) Performed: * No procedures listed *  Anesthesia type: Epidural  Patient location: Mother/Baby  Post pain: Pain level controlled  Post assessment: Post-op Vital signs reviewed  Last Vitals:  Filed Vitals:   08/19/11 0530  BP: 110/69  Pulse: 99  Temp: 36.7 C  Resp: 20    Post vital signs: Reviewed  Level of consciousness:alert  Complications: No apparent anesthesia complications

## 2011-08-19 NOTE — Progress Notes (Signed)
Post Partum Day 1 VBAC Subjective: no complaints and tolerating PO  Objective: Blood pressure 110/69, pulse 99, temperature 98.1 F (36.7 C), temperature source Oral, resp. rate 20, height 5' (1.524 m), weight 68.04 kg (150 lb), last menstrual period 11/14/2010, unknown if currently breastfeeding.  Physical Exam:  General: alert and cooperative Lochia: appropriate Uterine Fundus: firm    Basename 08/19/11 0605 08/18/11 0715  HGB 9.6* 10.6*  HCT 30.4* 32.6*    Assessment/Plan: Plan for discharge tomorrow D/w pt circumcision in detail, desires to proceed   LOS: 1 day   Richa Shor W 08/19/2011, 9:04 AM

## 2011-08-20 MED ORDER — IBUPROFEN 600 MG PO TABS: 600.0000 mg | ORAL_TABLET | Freq: Four times a day (QID) | ORAL | Status: AC

## 2011-08-20 MED ORDER — OXYCODONE-ACETAMINOPHEN 5-325 MG PO TABS: 1.0000 | ORAL_TABLET | ORAL | Status: AC | PRN

## 2011-08-20 NOTE — Discharge Summary (Signed)
Obstetric Discharge Summary Reason for Admission: induction of labor Prenatal Procedures: none Intrapartum Procedures: VBAC Postpartum Procedures: none Complications-Operative and Postpartum: first degree perineal laceration Hemoglobin  Date Value Range Status  08/19/2011 9.6* 12.0 - 15.0 g/dL Final     HCT  Date Value Range Status  08/19/2011 30.4* 36.0 - 46.0 % Final    Physical Exam:  General: alert and cooperative Lochia: appropriate Uterine Fundus: firm   Discharge Diagnoses: Term Pregnancy-delivered  Discharge Information: Date: 08/20/2011 Activity: pelvic rest Diet: routine Medications: Ibuprofen and Percocet Condition: improved Instructions: refer to practice specific booklet Discharge to: home Follow-up Information    Follow up with Oliver Pila, MD in 6 weeks.   Contact information:   [address] [place] [address] [phone]          Newborn Data: Live born female  Birth Weight: 7 lb 14.8 oz (3595 g) APGAR: 9, 9  Home with mother.  Oliver Pila 08/20/2011, 7:26 AM

## 2011-08-20 NOTE — Progress Notes (Signed)
Post Partum Day 2 Subjective: no complaints, up ad lib and tolerating PO  Objective: Blood pressure 114/72, pulse 84, temperature 97.6 F (36.4 C), temperature source Oral, resp. rate 18, height 5' (1.524 m), weight 68.04 kg (150 lb), last menstrual period 11/14/2010, unknown if currently breastfeeding.  Physical Exam:  General: alert and cooperative Lochia: appropriate Uterine Fundus: firm    Basename 08/19/11 0605 08/18/11 0715  HGB 9.6* 10.6*  HCT 30.4* 32.6*    Assessment/Plan: Discharge home Motrin percocet F/u 6 weeks   LOS: 2 days   Grantham Hippert W 08/20/2011, 9:23 AM

## 2011-08-21 ENCOUNTER — Inpatient Hospital Stay (HOSPITAL_COMMUNITY): Admission: AD | Admit: 2011-08-21 | Payer: Self-pay | Source: Ambulatory Visit | Admitting: Obstetrics and Gynecology

## 2011-10-18 IMAGING — US US OB DETAIL+14 WK
1 series · 18 of 28 positions shown · non-contrast
Comparison: none

OBSTETRICAL ULTRASOUND:
 This ultrasound was performed in The [HOSPITAL], and the [facility] report will be stored to [REDACTED] PACS.  This report is also available in [HOSPITAL]?s accessANYware.

[Series 1: us ob detail+14 wk · 108 acquisitions, 18 frames shown]
[im 1/108]
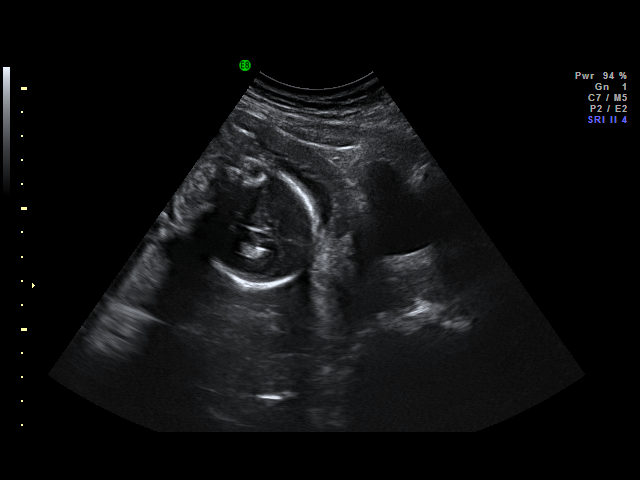
[im 8/108]
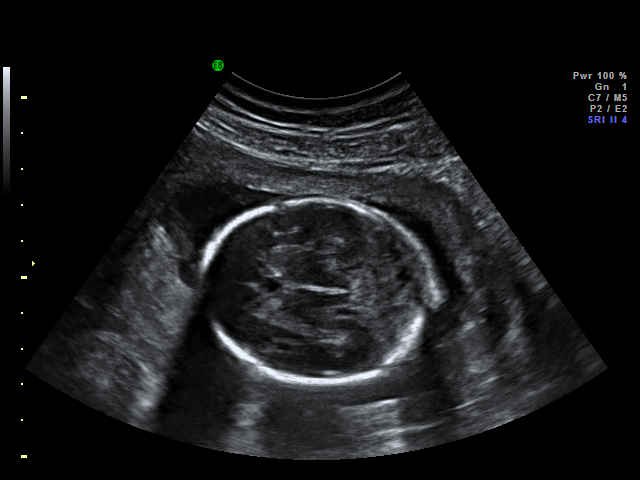
[im 12/108]
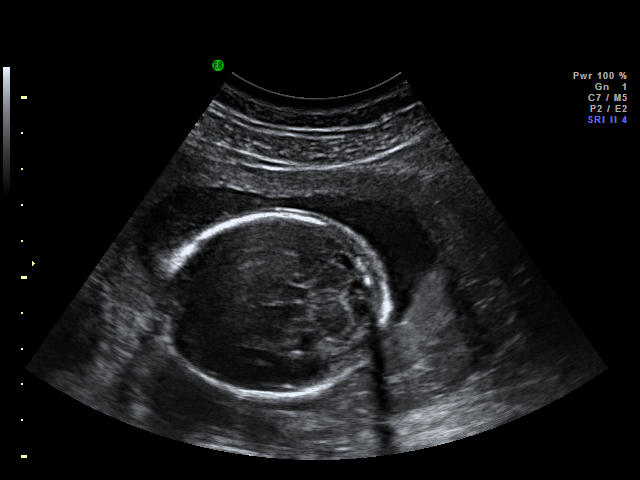
[im 20/108]
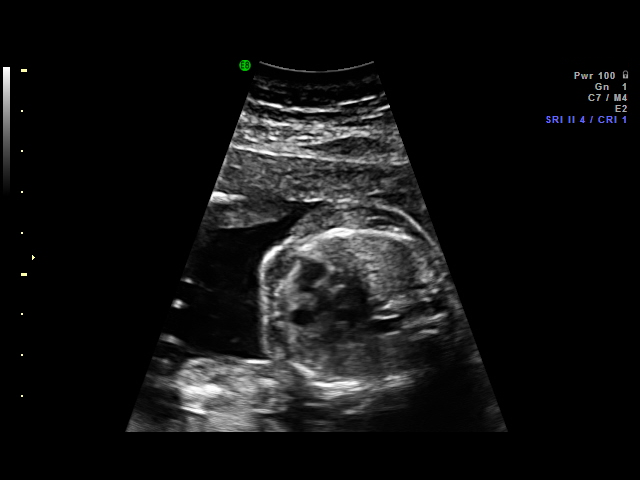
[im 28/108]
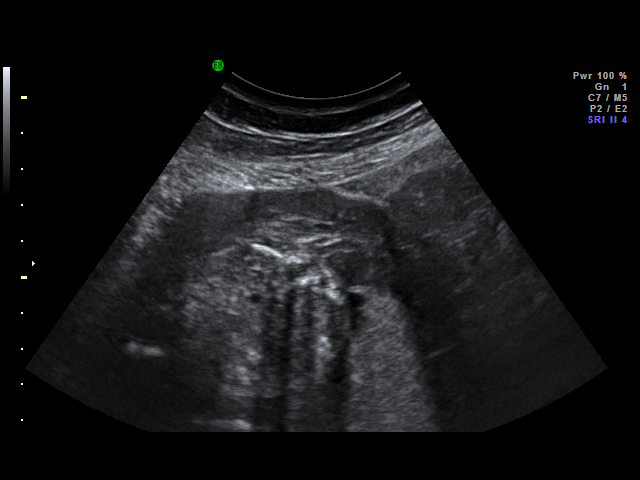
[im 32/108]
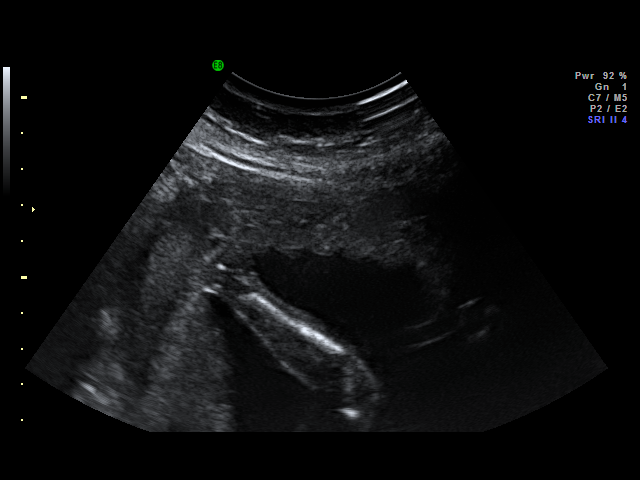
[im 40/108]
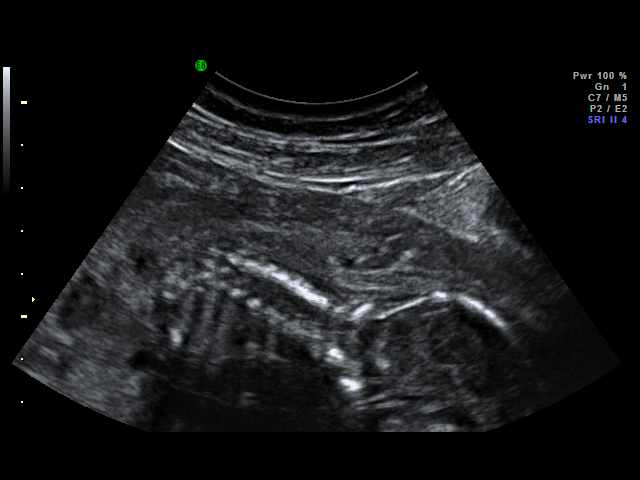
[im 44/108]
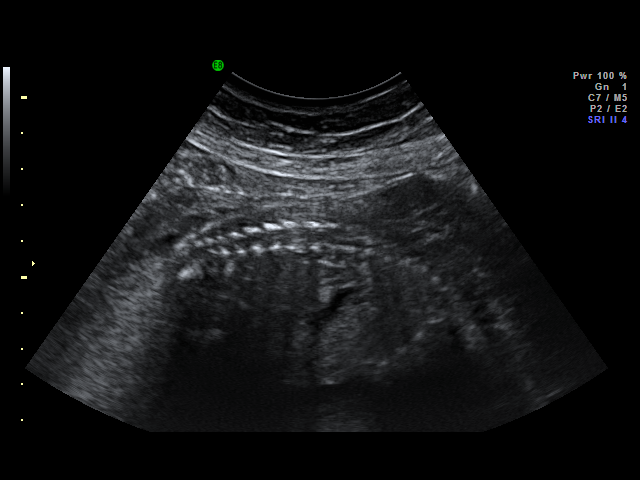
[im 52/108]
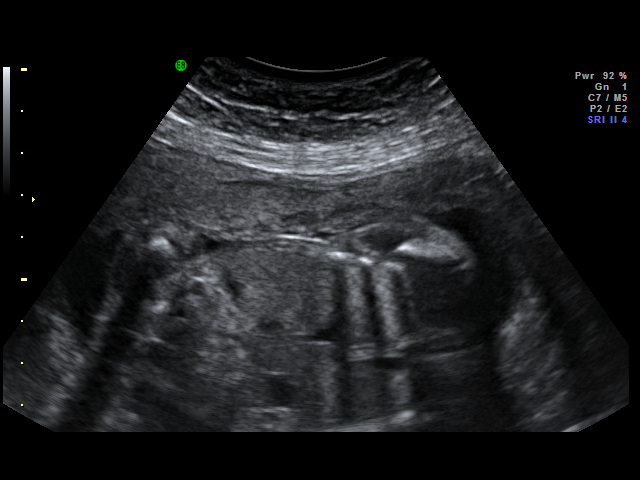
[im 56/108]
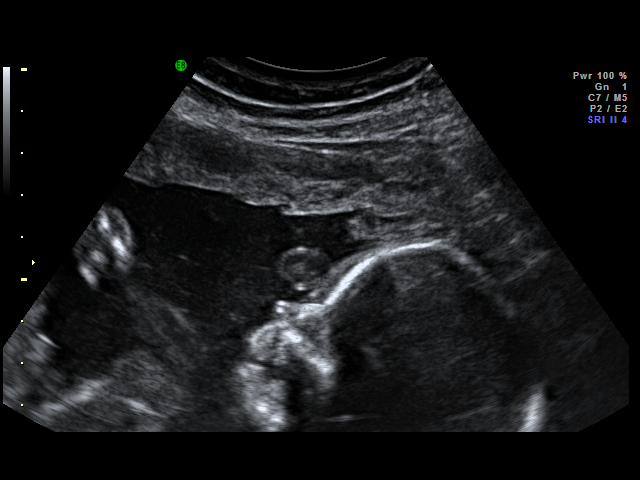
[im 64/108]
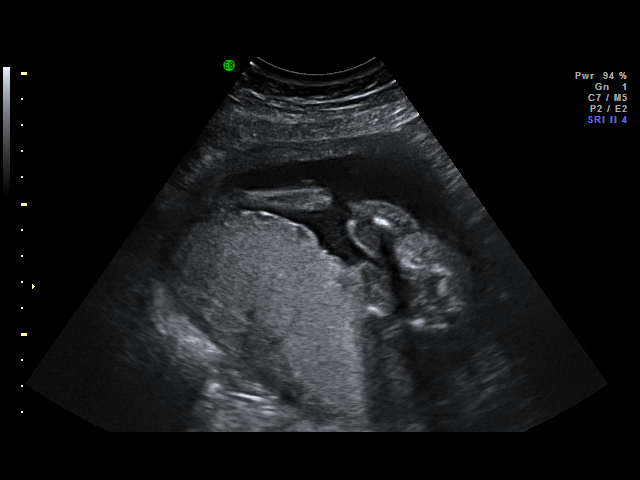
[im 68/108]
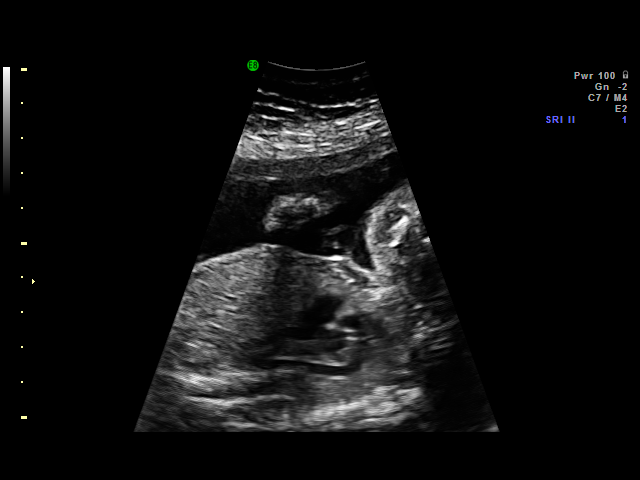
[im 76/108]
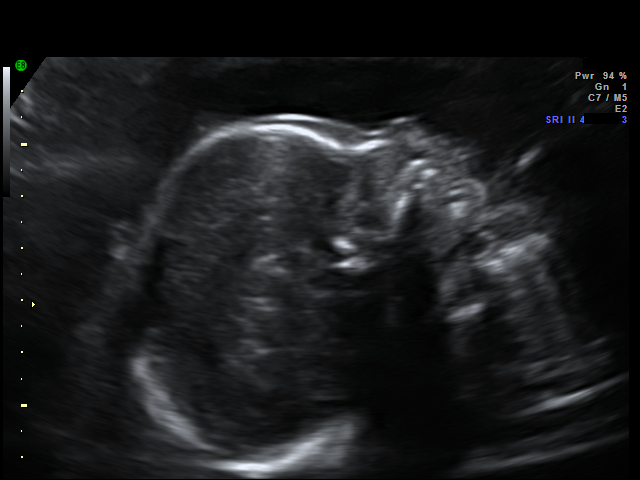
[im 84/108]
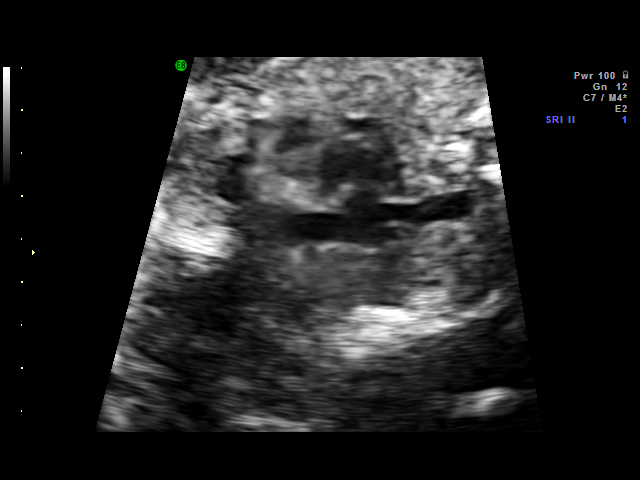
[im 88/108]
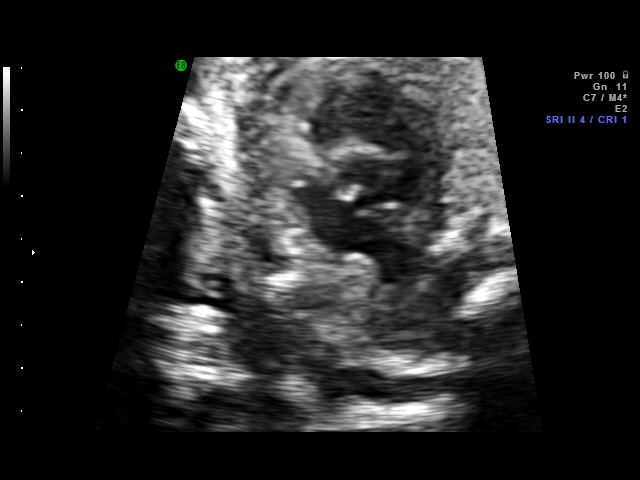
[im 96/108]
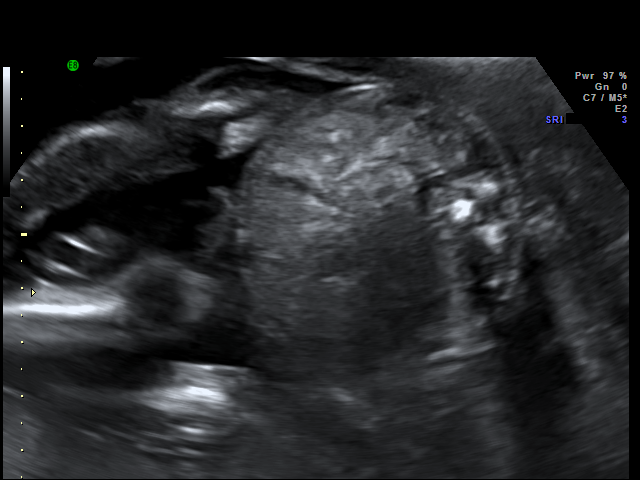
[im 100/108]
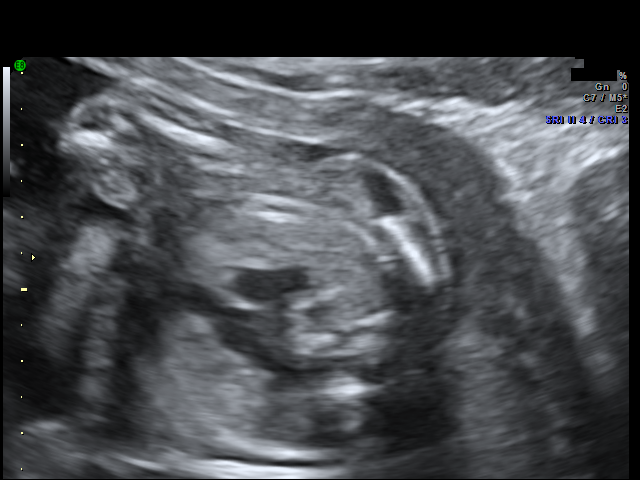
[im 108/108]
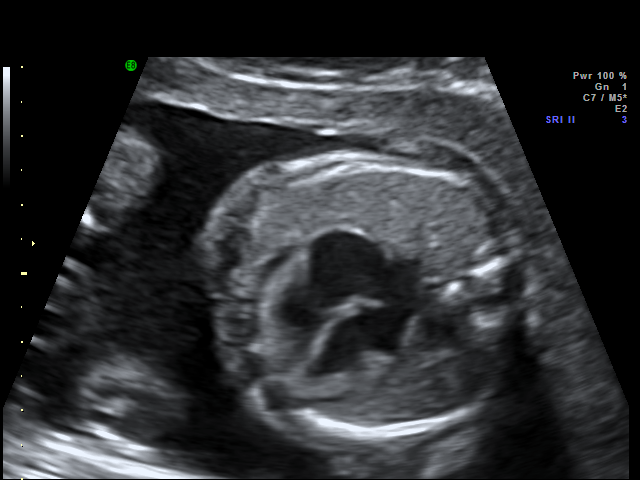

[18 of 28 positions shown; findings below may reference images not displayed]

IMPRESSION: [facility] has also been faxed to the ordering physician.

## 2011-12-12 IMAGING — US US FETAL BPP W/O NONSTRESS
1 series · 4 of 4 positions shown · non-contrast
Comparison: none

OBSTETRICAL ULTRASOUND:
 This ultrasound exam was performed in the [HOSPITAL] Ultrasound Department.  The OB US report was generated in the [facility] system, and faxed to the ordering physician.  This report is also available in [HOSPITAL]?s AccessANYware and in [REDACTED] PACS.

[Series 1: us fetal bpp w/o nonstress · non-contrast · 0.27mm/px · 4 acquisitions, 4 frames shown]
[im 1/4]
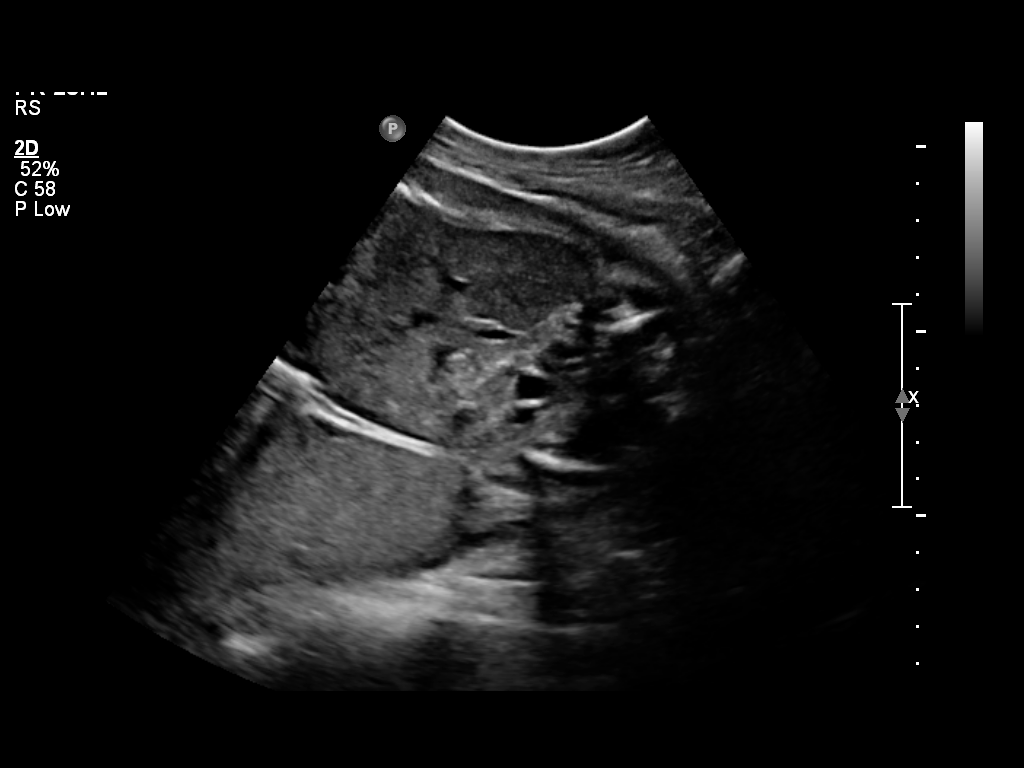
[im 2/4]
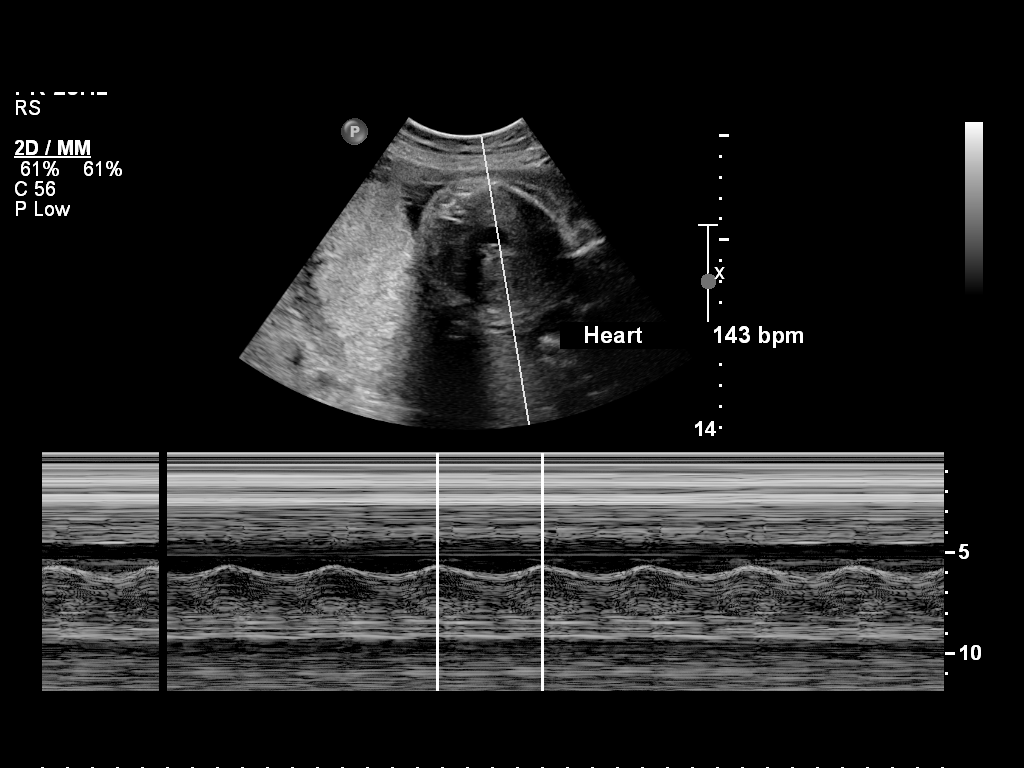
[im 3/4]
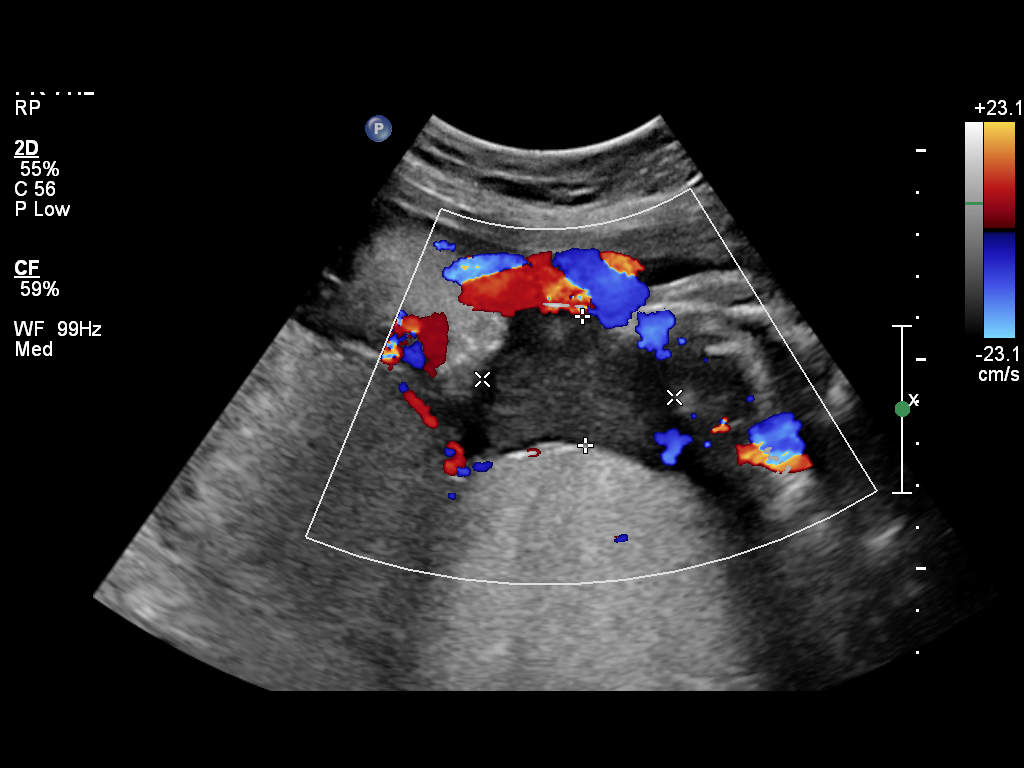
[im 4/4]
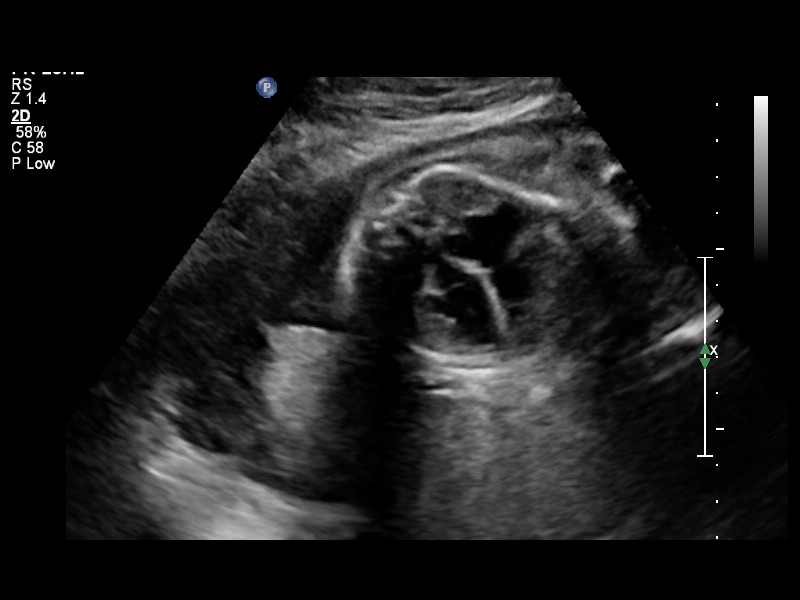

[4 of 4 positions shown; findings below may reference images not displayed]

IMPRESSION: See [facility] Obstetric US report.

## 2011-12-13 IMAGING — US US FETAL BPP W/O NONSTRESS
1 series · 14 of 28 positions shown · non-contrast
Comparison: none

OBSTETRICAL ULTRASOUND:
 This ultrasound was performed in The [HOSPITAL], and the [facility] report will be stored to [REDACTED] PACS.  This report is also available in [HOSPITAL]?s accessANYware.

[Series 1: us fetal bpp w/o nonstress · 39 acquisitions, 14 frames shown]
[im 2/39]
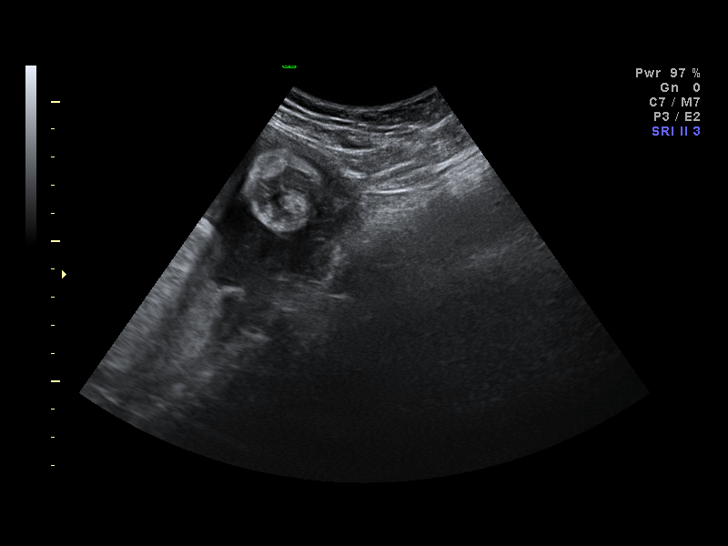
[im 5/39]
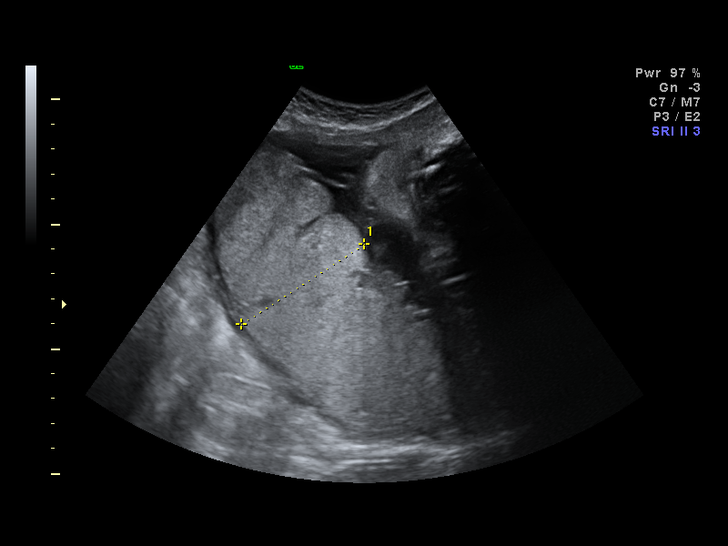
[im 8/39]
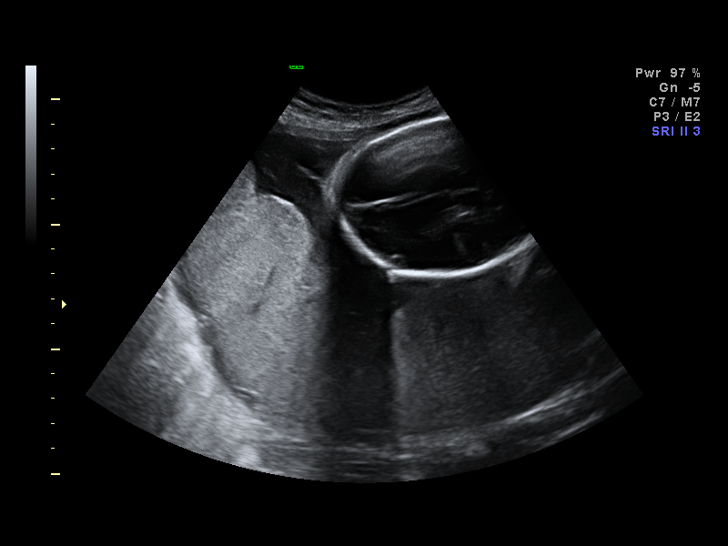
[im 10/39]
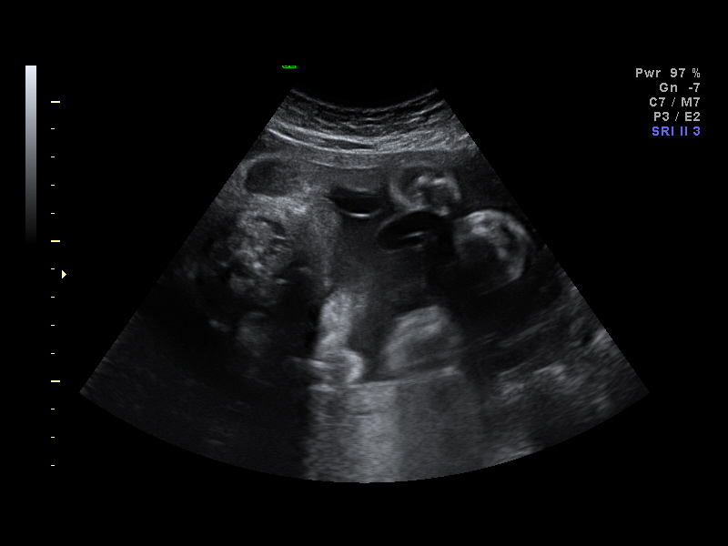
[im 13/39]
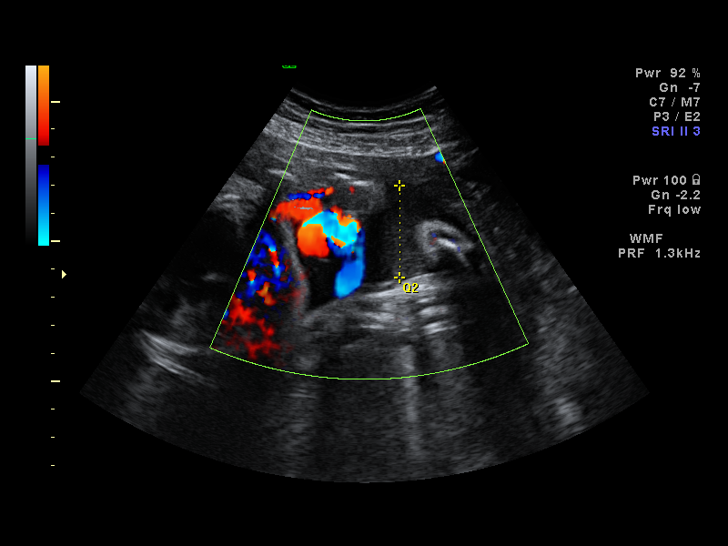
[im 16/39]
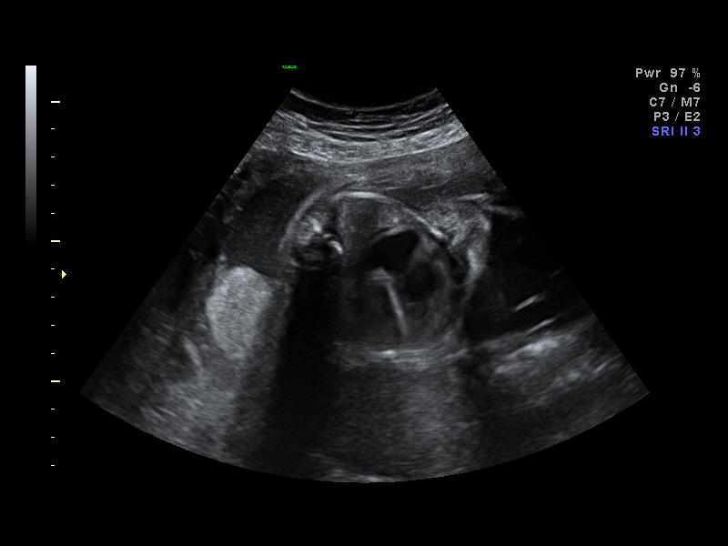
[im 19/39]
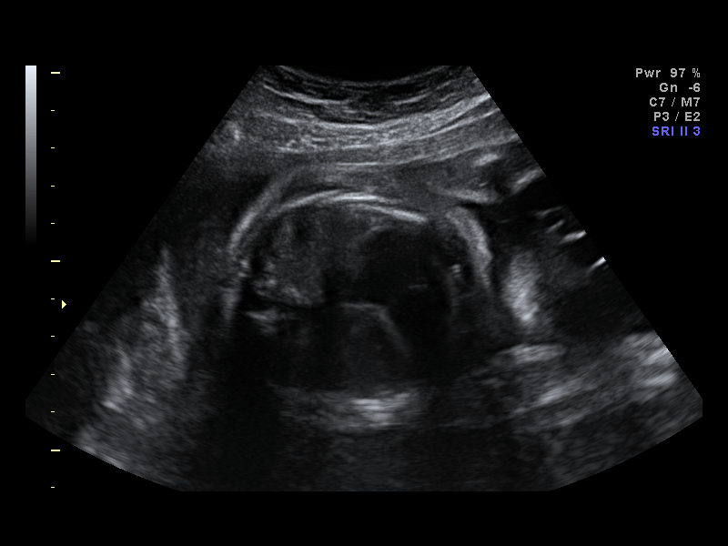
[im 22/39]
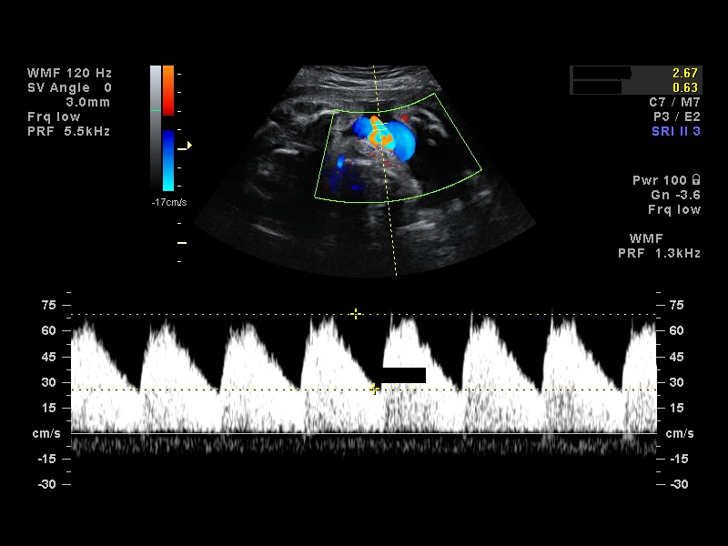
[im 24/39]
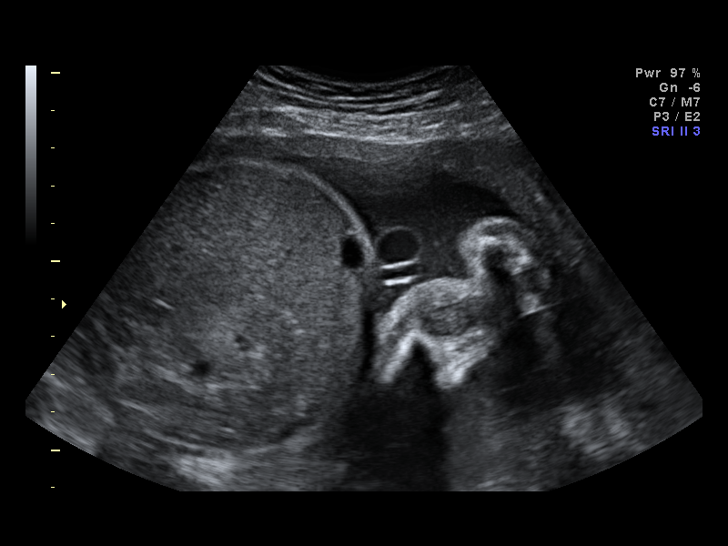
[im 27/39]
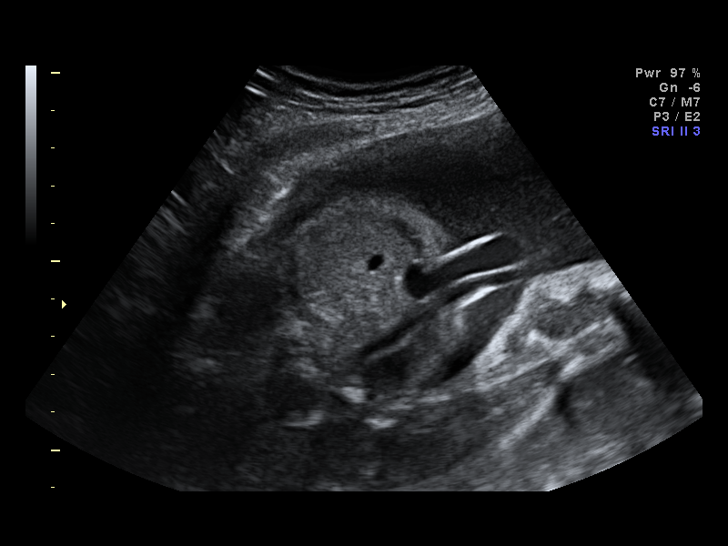
[im 30/39]
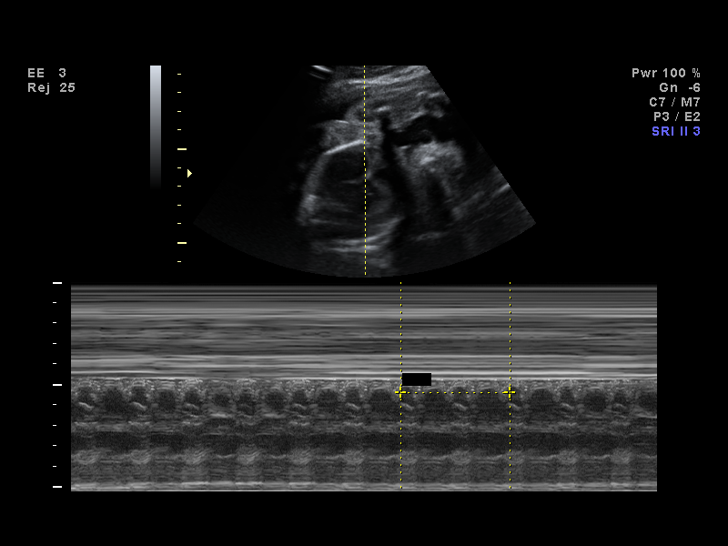
[im 33/39]
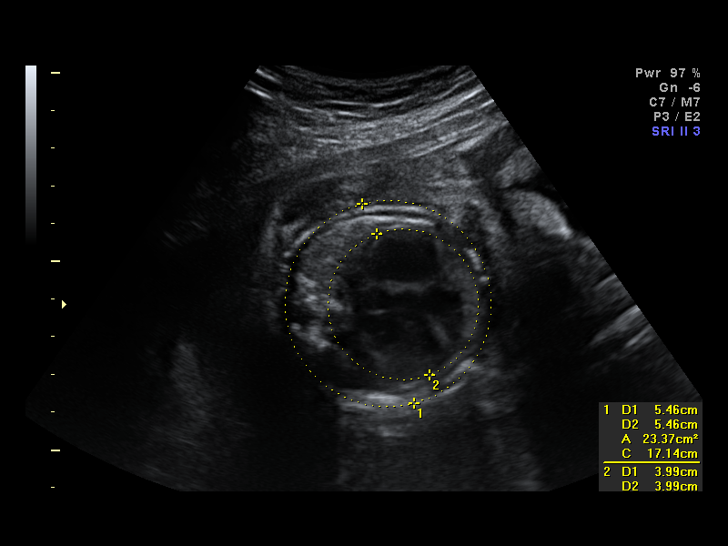
[im 36/39]
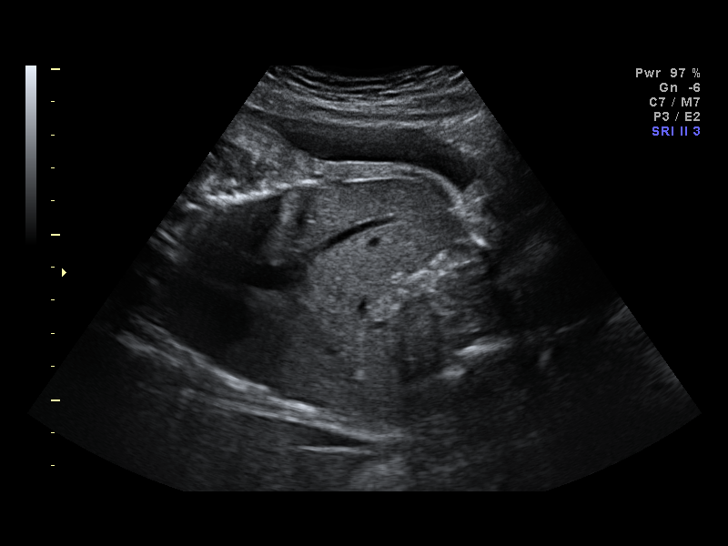
[im 39/39]
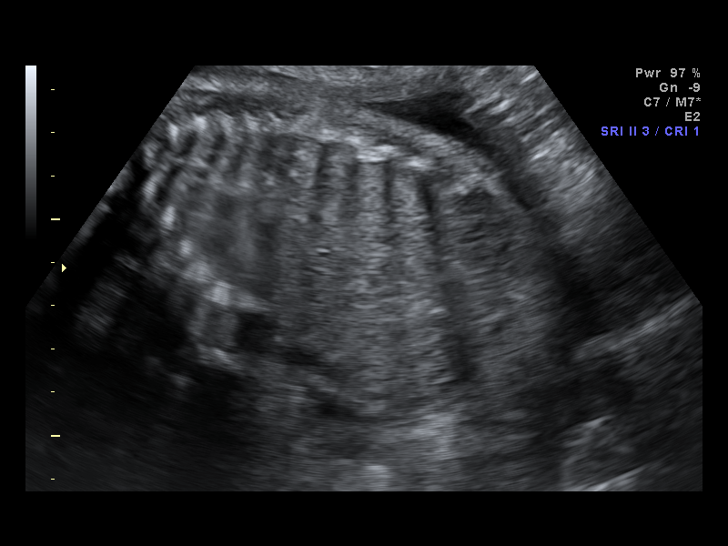

[14 of 28 positions shown; findings below may reference images not displayed]

IMPRESSION: [facility] has also been faxed to the ordering physician.

## 2011-12-14 IMAGING — US US FETAL BPP W/O NONSTRESS
1 series · 8 of 8 positions shown · non-contrast
Comparison: none

OBSTETRICAL ULTRASOUND:
 This ultrasound was performed in The [HOSPITAL], and the [facility] OB/GYN report will be stored to [REDACTED] PACS.  This report is also available in [HOSPITAL]?s accessANYware.

[Series 1: us fetal bpp w/o nonstress · 0.27mm/px · 8 of 8 slices shown]
[im 1/8]
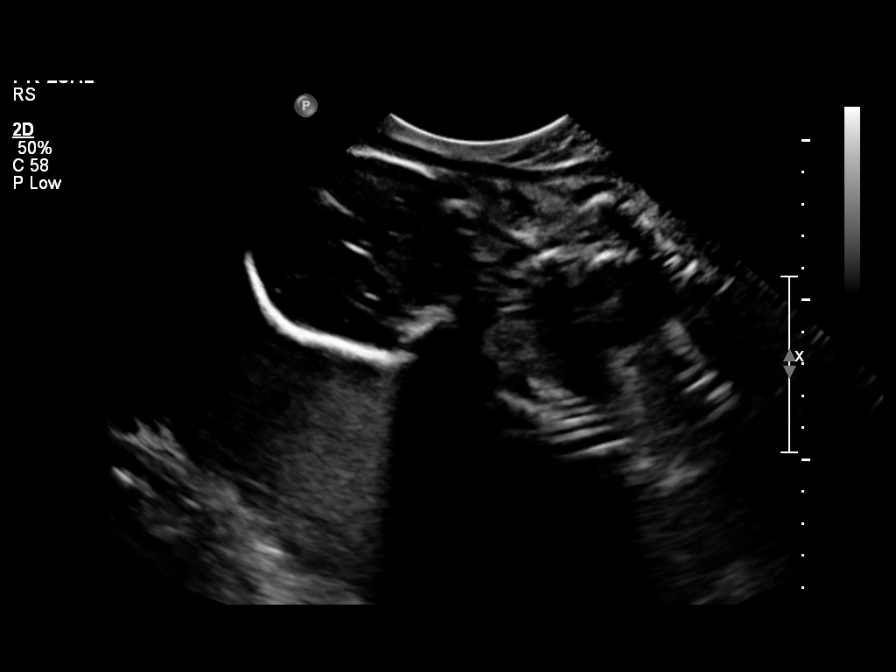
[im 2/8]
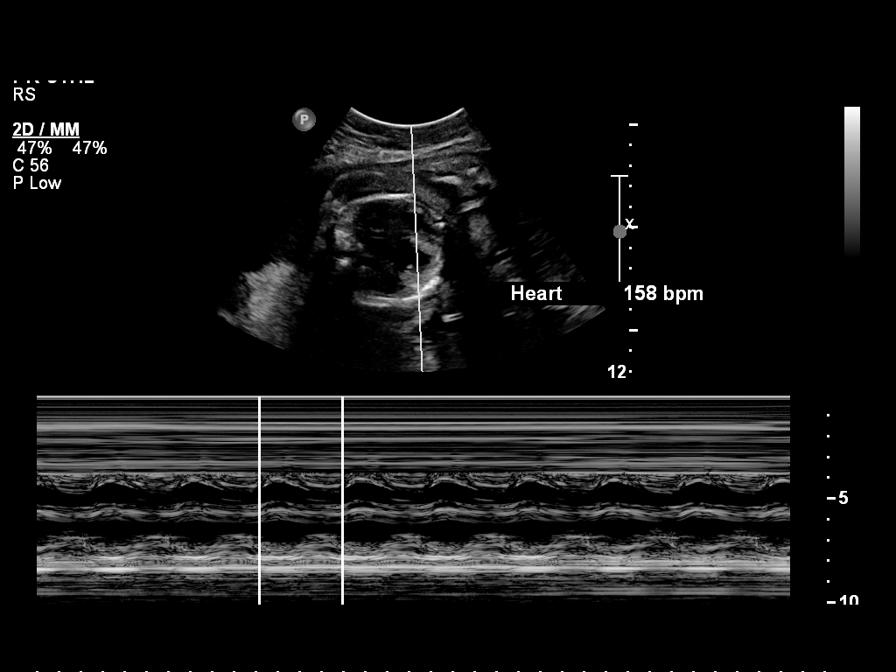
[im 3/8]
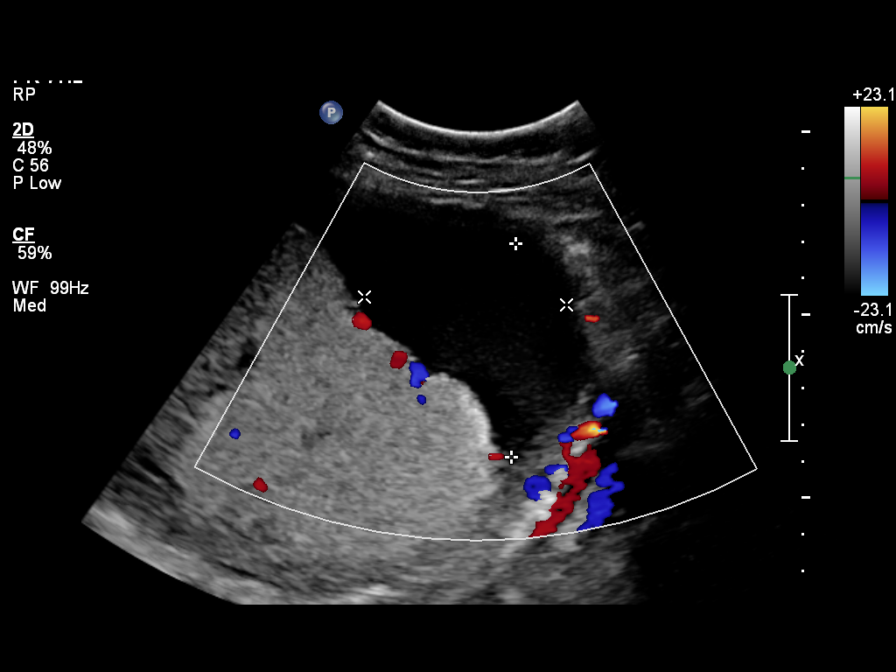
[im 4/8]
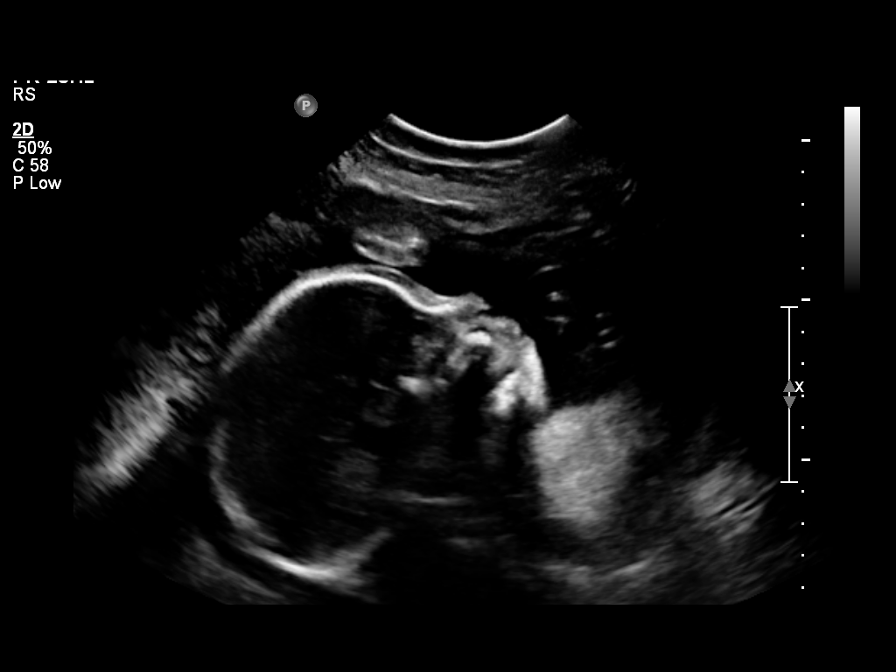
[im 5/8]
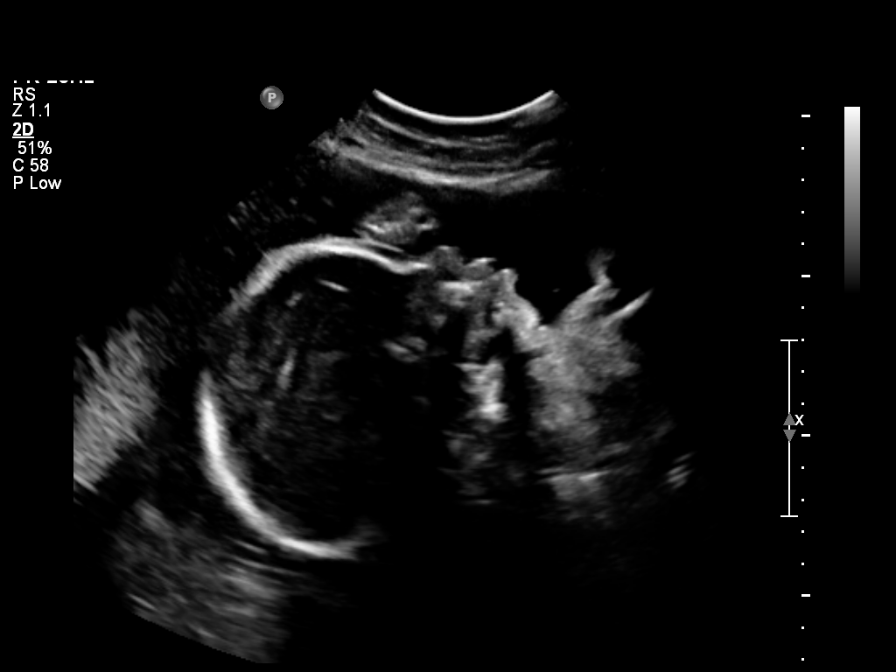
[im 6/8]
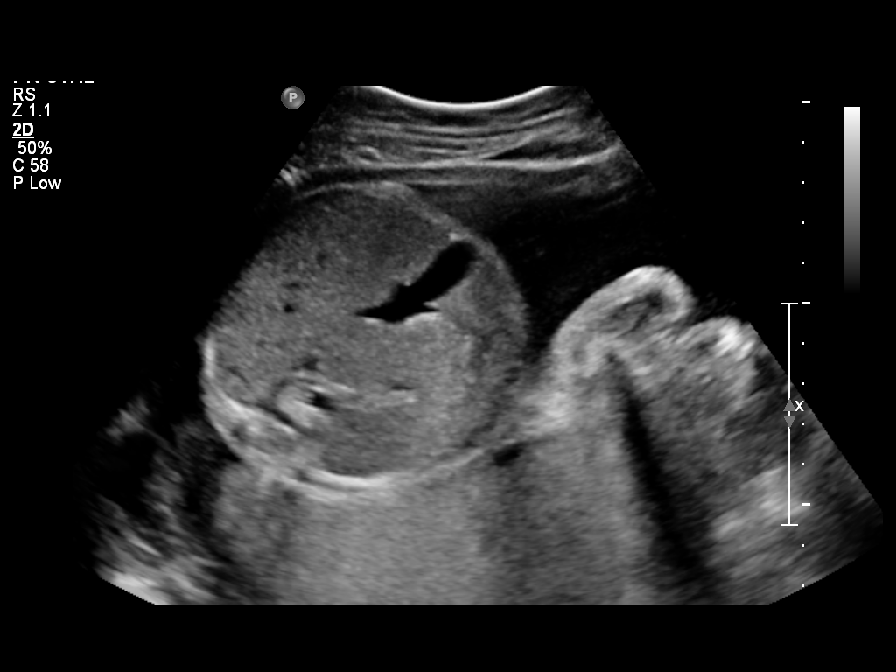
[im 7/8]
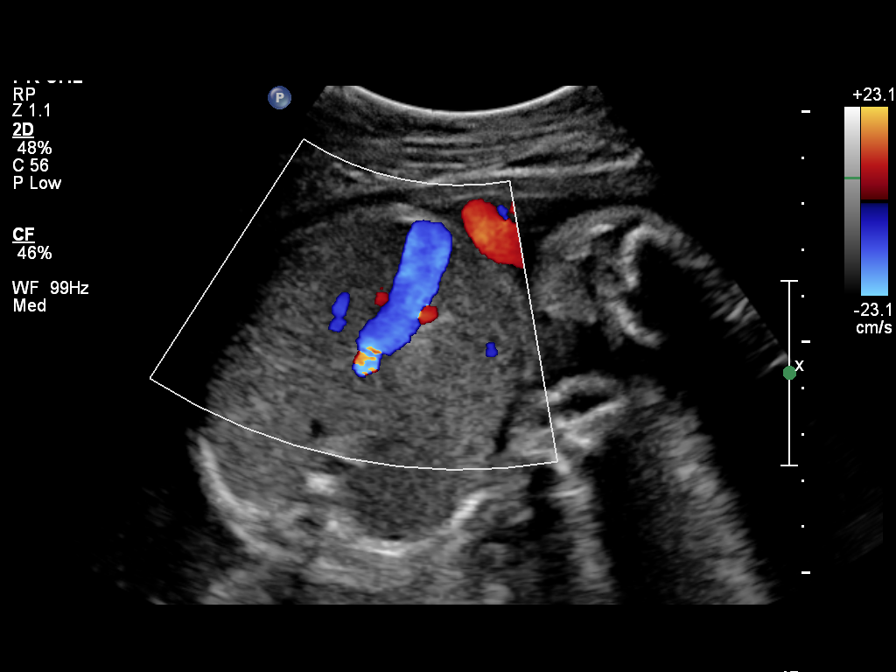
[im 8/8]
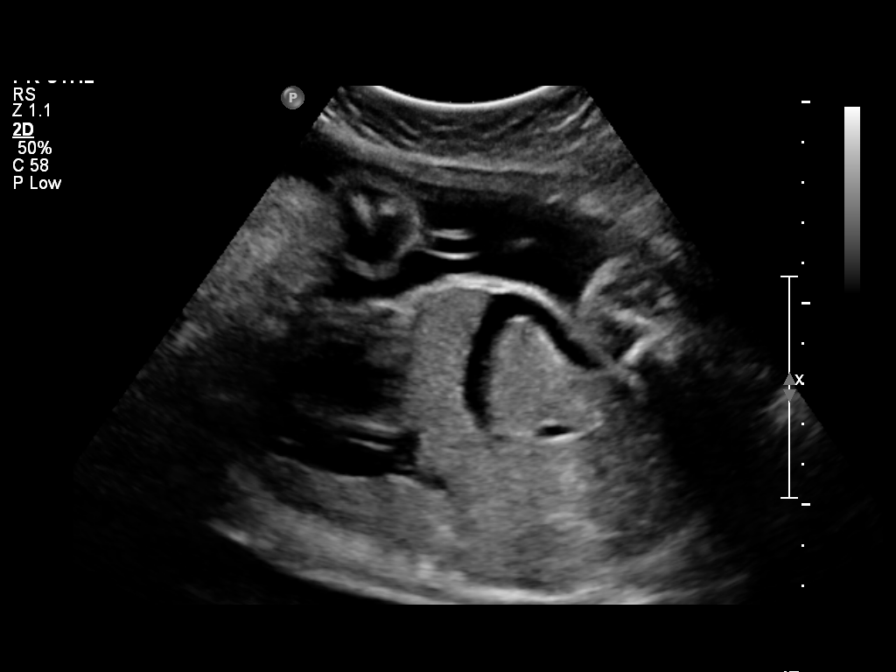

[8 of 8 positions shown; findings below may reference images not displayed]

IMPRESSION: [facility] has also been faxed to the ordering physician.

## 2011-12-18 IMAGING — US US FETAL BPP W/O NONSTRESS
1 series · 9 of 9 positions shown · non-contrast
Comparison: none

OBSTETRICAL ULTRASOUND:
 This ultrasound exam was performed in the [HOSPITAL] Ultrasound Department.  The OB US report was generated in the [facility] system, and faxed to the ordering physician.  This report is also available in [HOSPITAL]?s AccessANYware and in [REDACTED] PACS.

[Series 1: us fetal bpp w/o nonstress · non-contrast · 0.24mm/px · 9 of 9 slices shown]
[im 1/9]
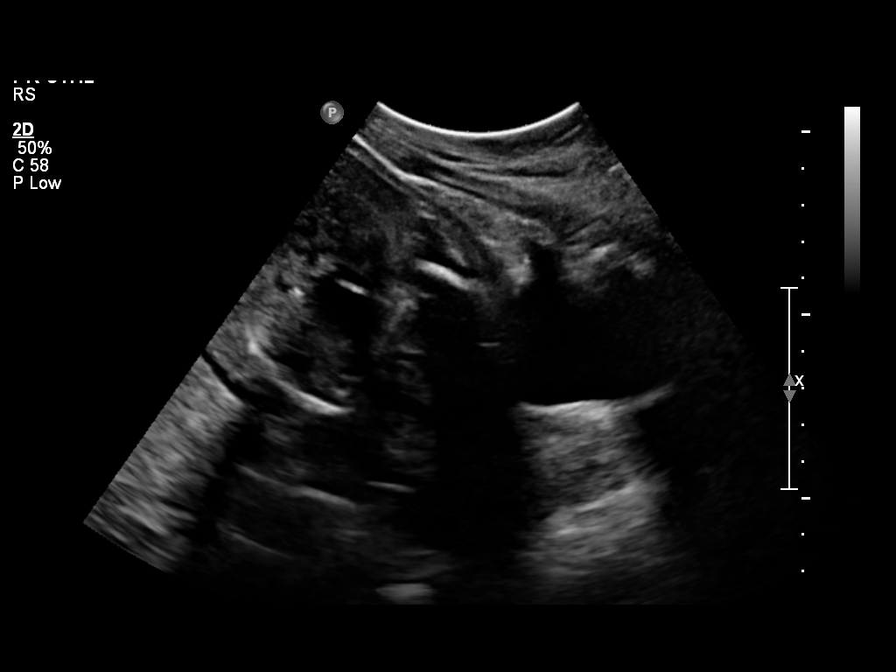
[im 2/9]
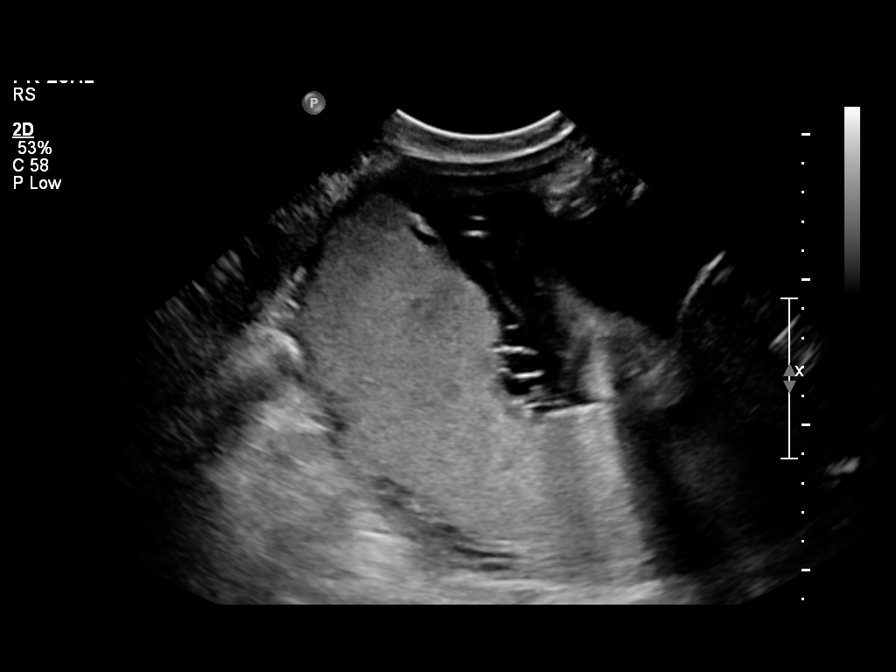
[im 3/9]
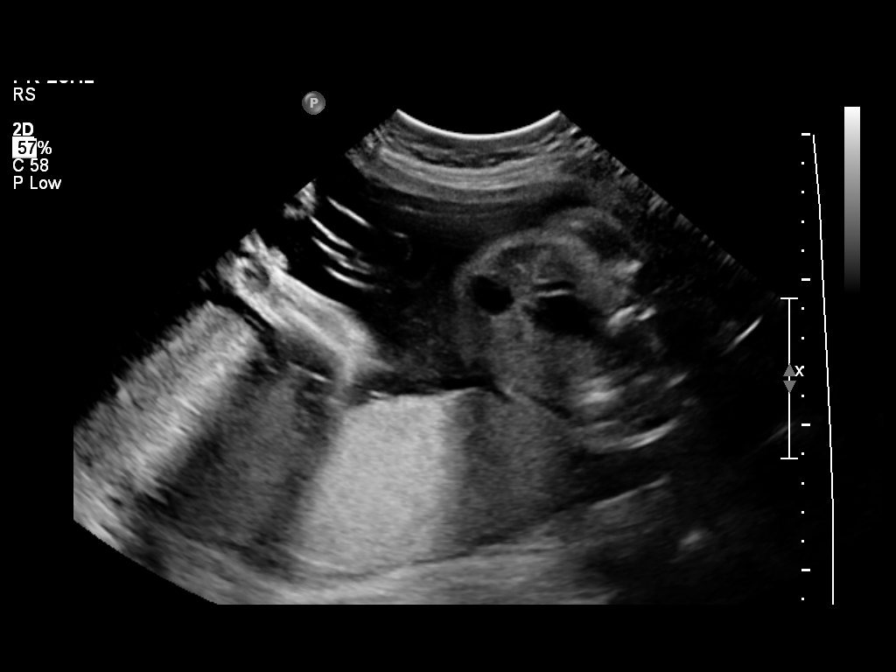
[im 4/9]
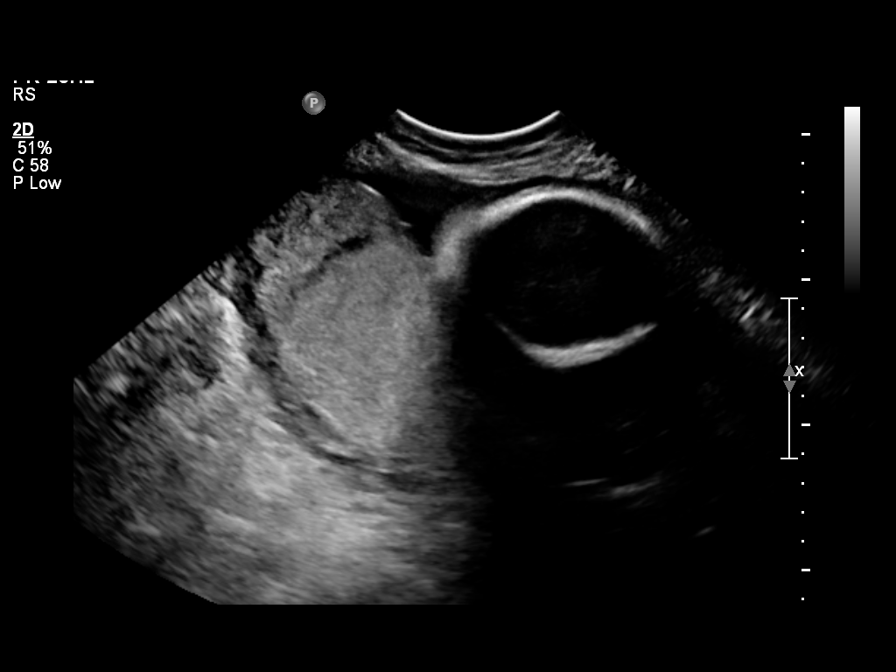
[im 5/9]
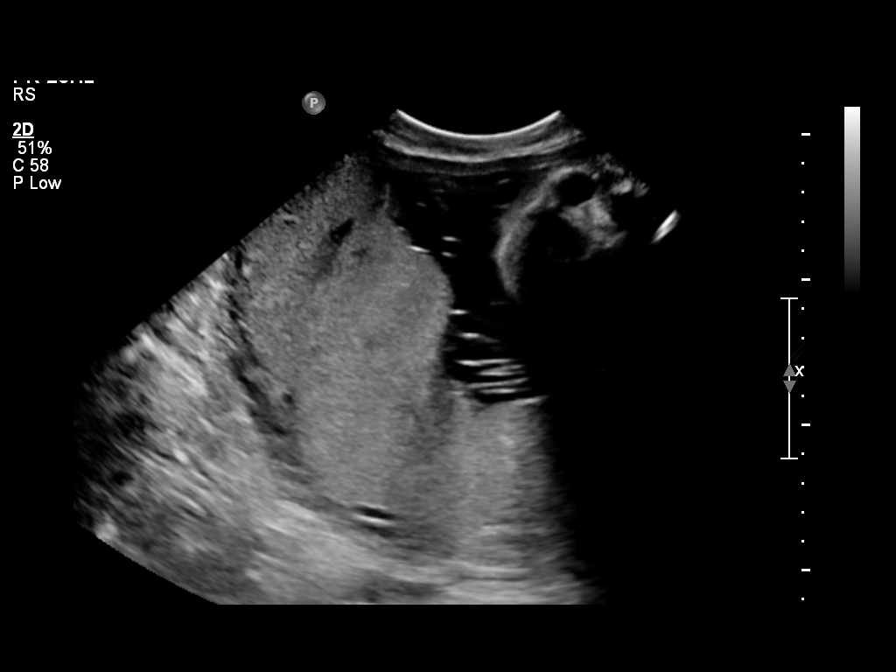
[im 6/9]
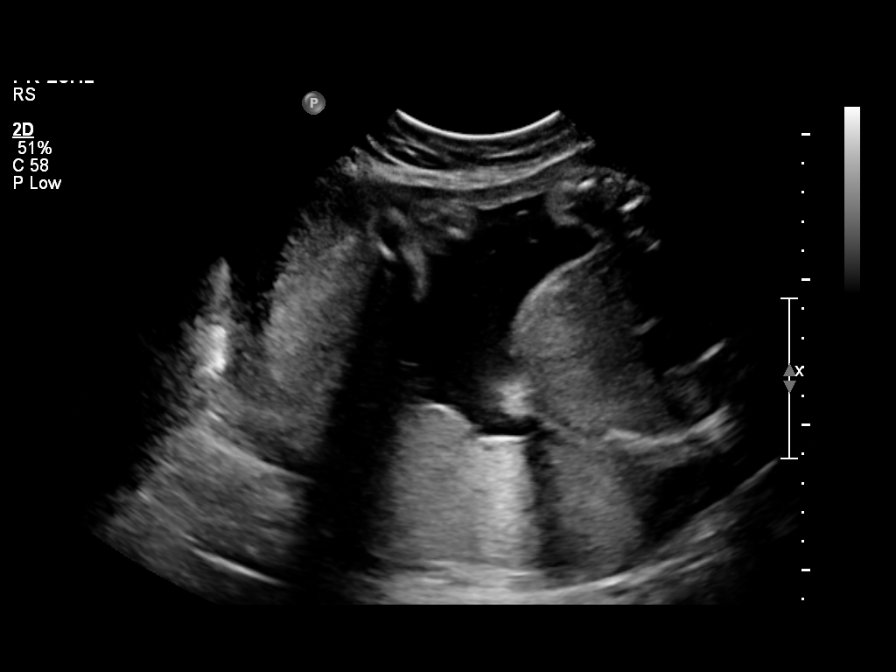
[im 7/9]
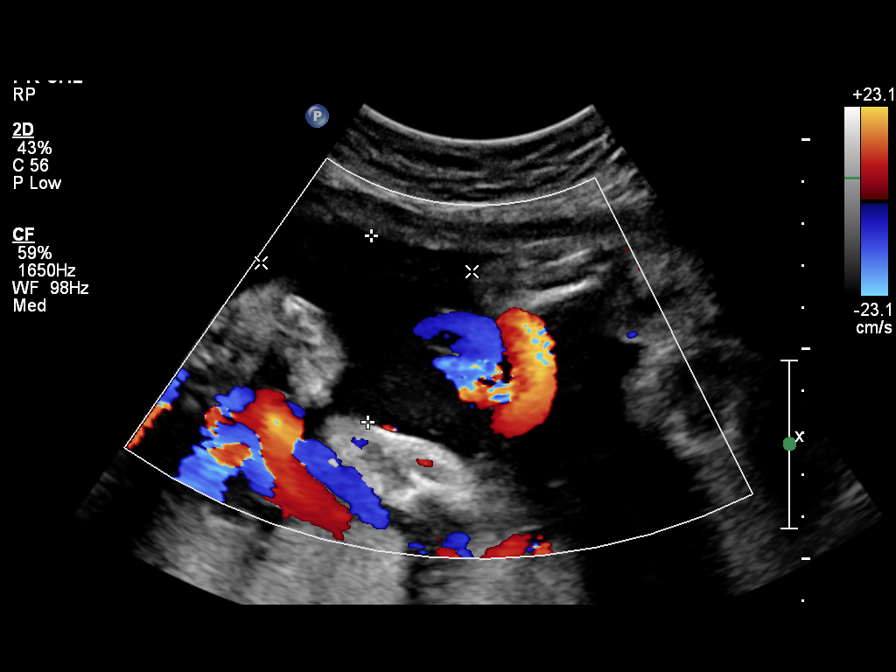
[im 8/9]
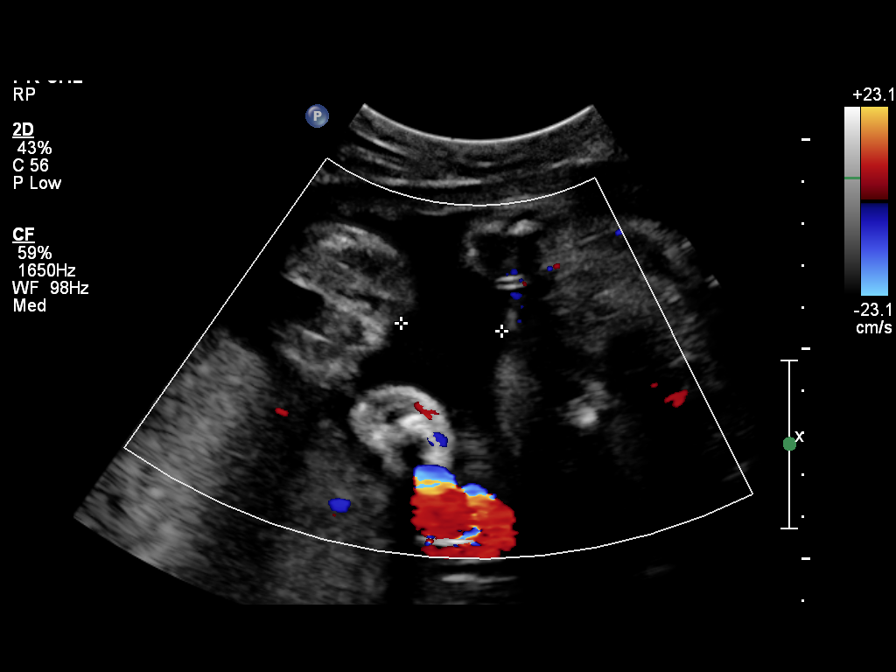
[im 9/9]
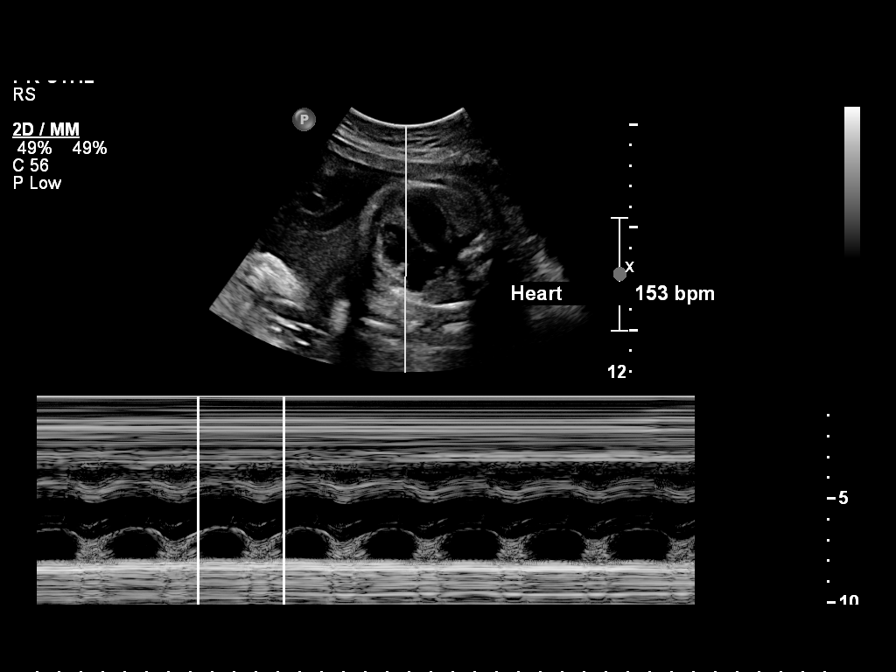

[9 of 9 positions shown; findings below may reference images not displayed]

IMPRESSION: See [facility] Obstetric US report.

## 2011-12-21 IMAGING — US US FETAL BPP W/O NONSTRESS
1 series · 3 of 3 positions shown · non-contrast
Comparison: none

OBSTETRICAL ULTRASOUND:
 This ultrasound was performed in The [HOSPITAL], and the [facility] report will be stored to [REDACTED] PACS.  This report is also available in [HOSPITAL]?s accessANYware.

[Series 1: us fetal bpp w/o nonstress · 3 of 3 slices shown]
[im 1/3]
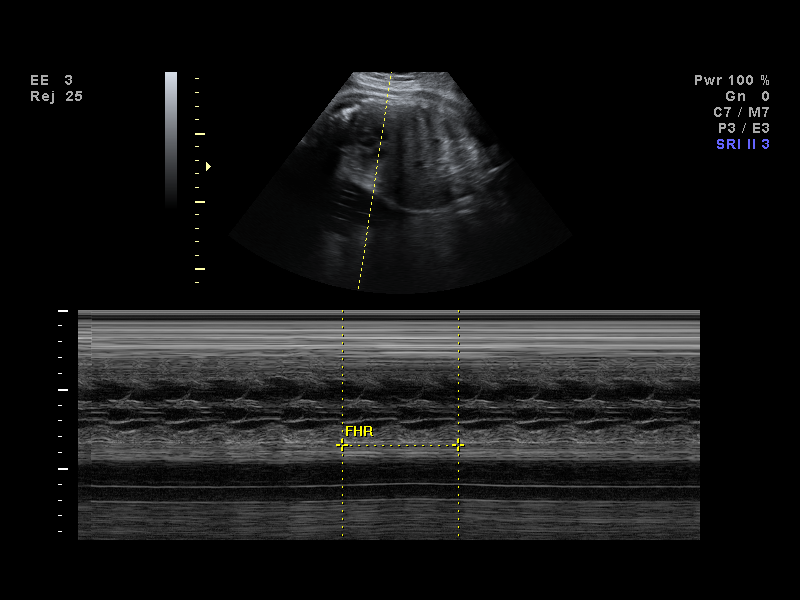
[im 2/3]
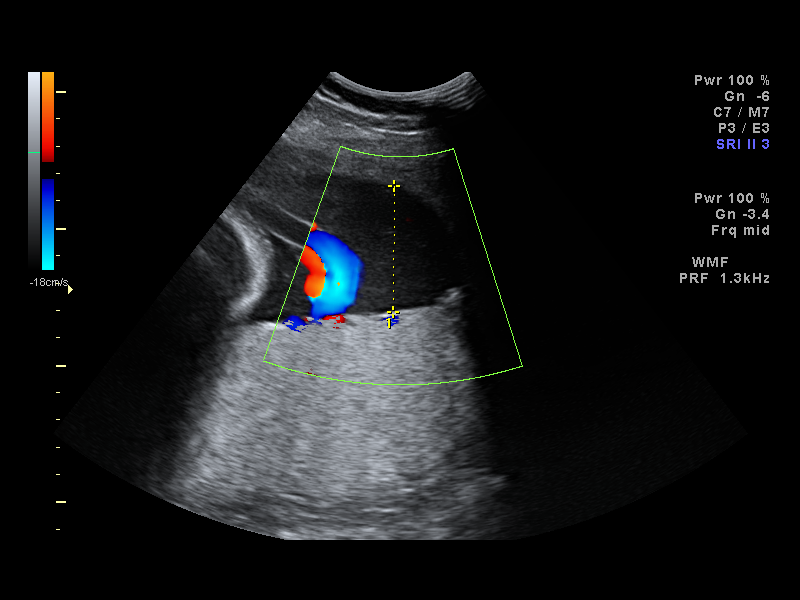
[im 3/3]
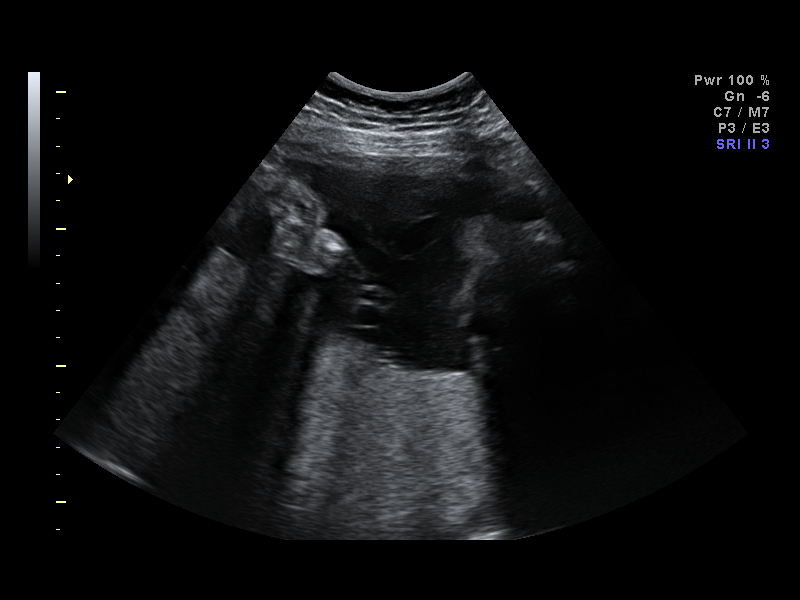

[3 of 3 positions shown; findings below may reference images not displayed]

IMPRESSION: [facility] has also been faxed to the ordering physician.

## 2011-12-23 IMAGING — US US FETAL BPP W/O NONSTRESS
1 series · 9 of 9 positions shown · non-contrast
Comparison: none

OBSTETRICAL ULTRASOUND:
 This ultrasound was performed in The [HOSPITAL], and the [facility] report will be stored to [REDACTED] PACS.  This report is also available in [HOSPITAL]?s accessANYware.

[Series 1: us fetal bpp w/o nonstress · 9 of 9 slices shown]
[im 1/9]
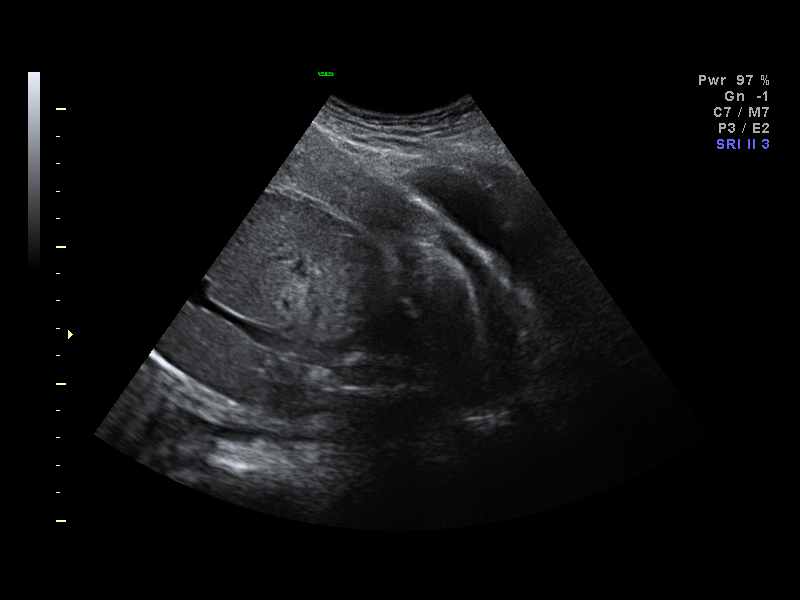
[im 2/9]
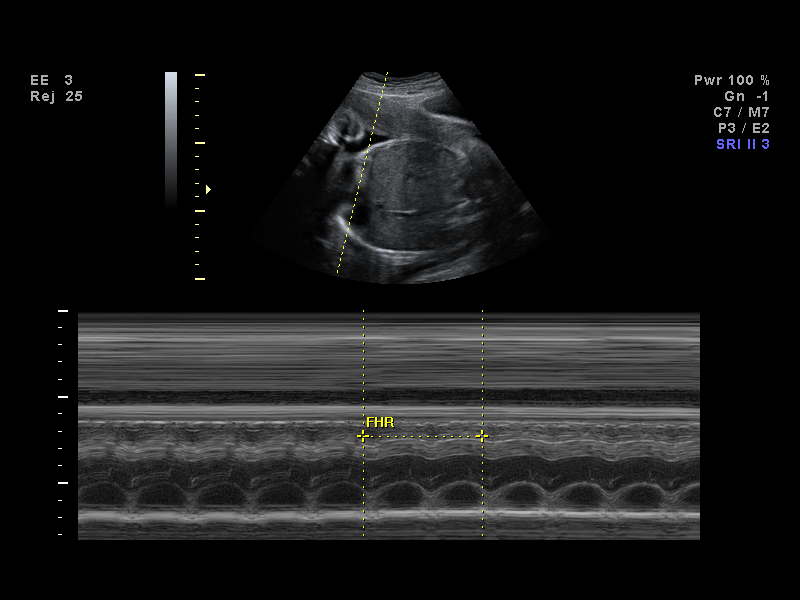
[im 3/9]
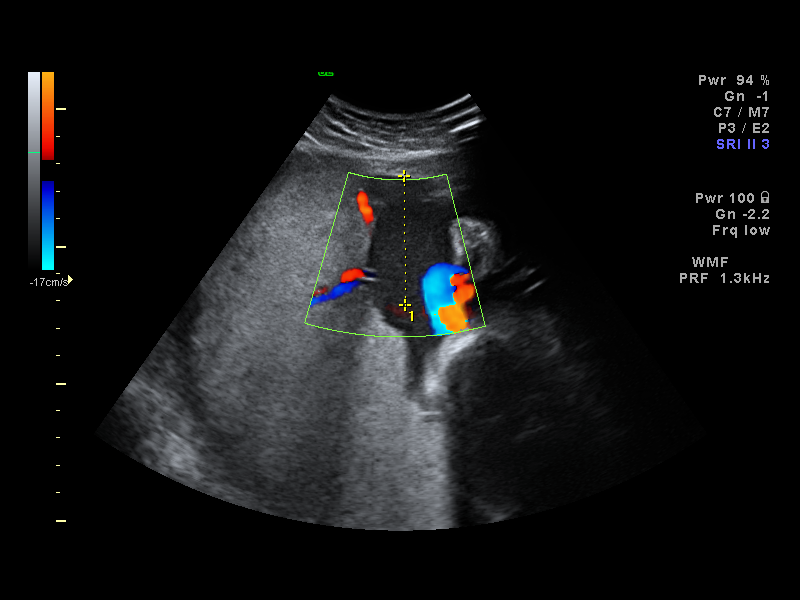
[im 4/9]
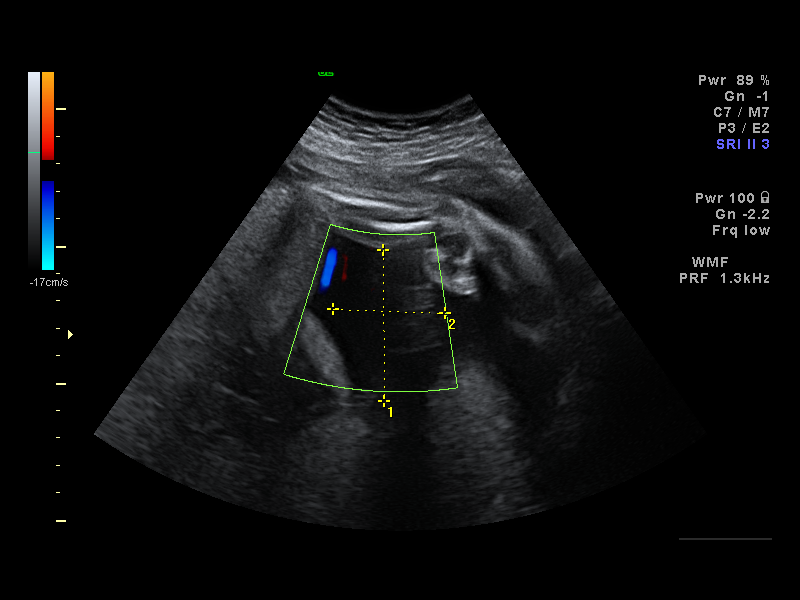
[im 5/9]
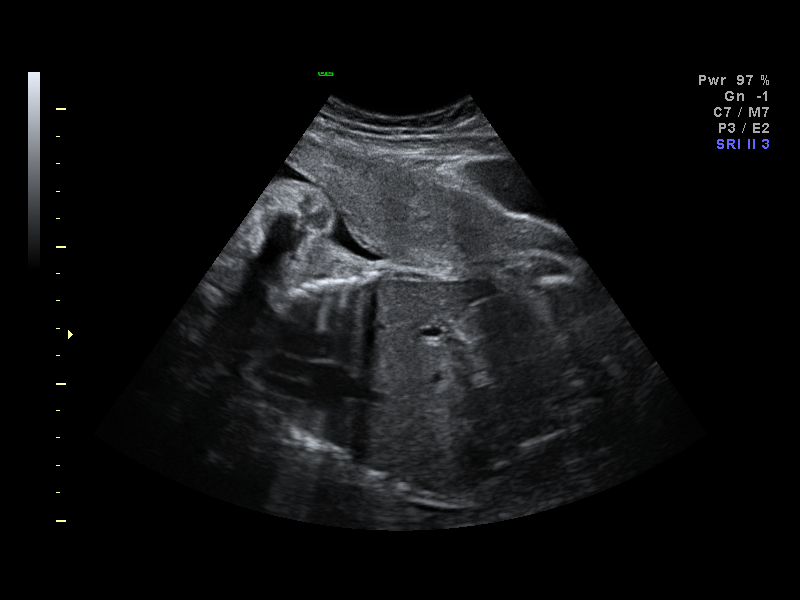
[im 6/9]
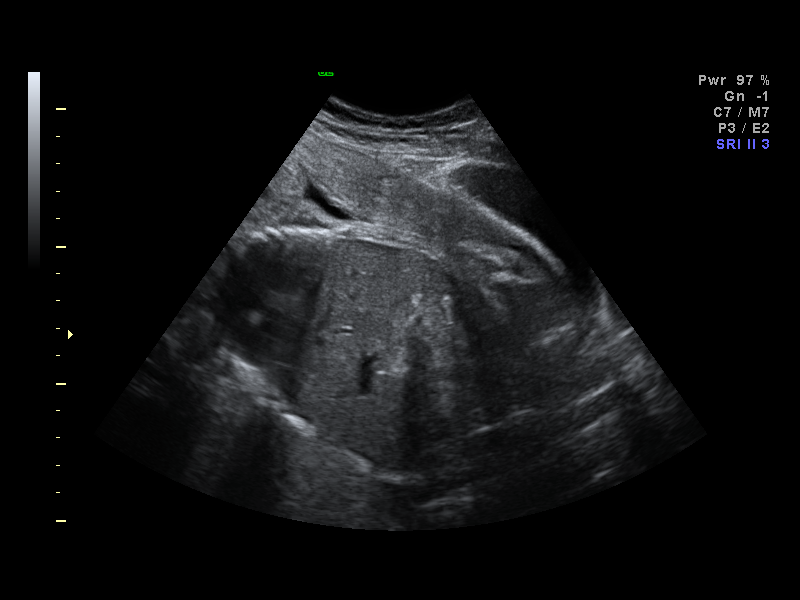
[im 7/9]
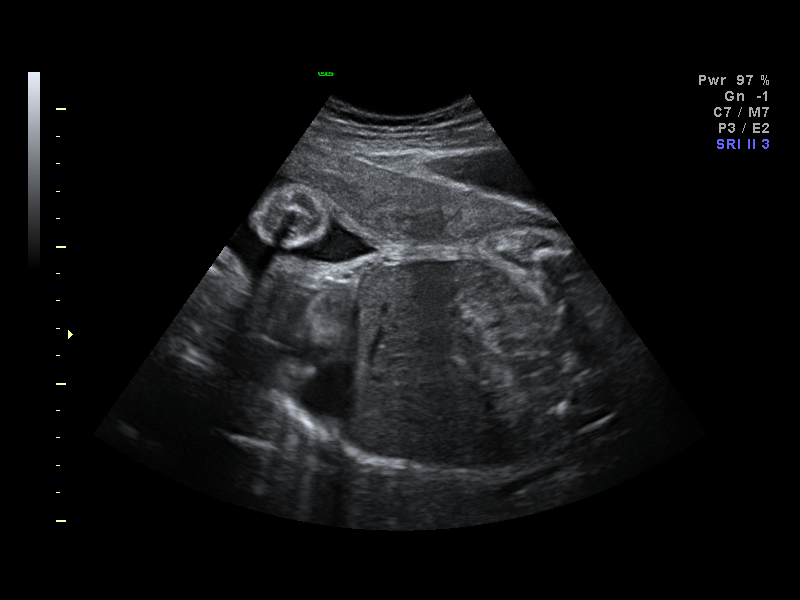
[im 8/9]
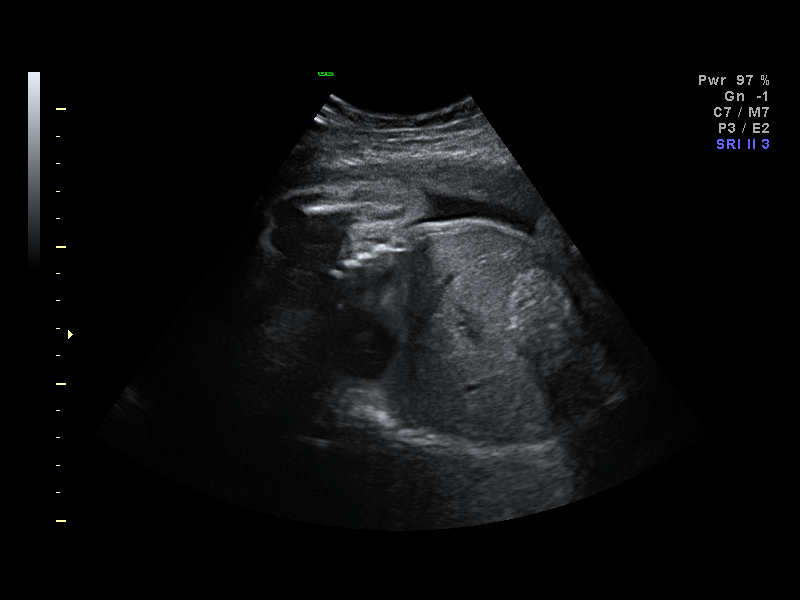
[im 9/9]
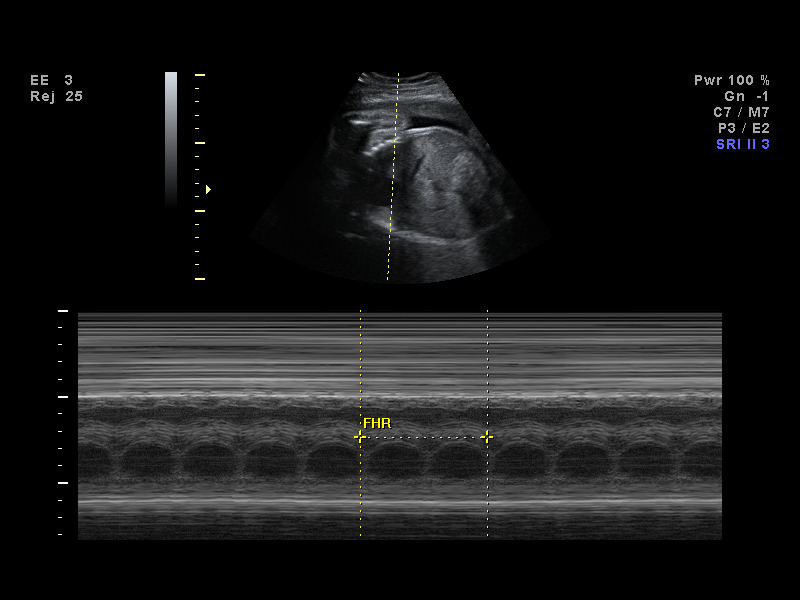

[9 of 9 positions shown; findings below may reference images not displayed]

IMPRESSION: [facility] has also been faxed to the ordering physician.

## 2011-12-26 IMAGING — US US FETAL BPP W/O NONSTRESS
1 series · 7 of 7 positions shown · non-contrast
Comparison: none

OBSTETRICAL ULTRASOUND:
 This ultrasound exam was performed in the [HOSPITAL] Ultrasound Department.  The OB US report was generated in the [facility] system, and faxed to the ordering physician.  This report is also available in [HOSPITAL]?s Access[employer]ware and in [REDACTED] PACS.

[Series 1: us fetal bpp w/o nonstress · non-contrast · 0.25mm/px · 7 acquisitions, 7 frames shown]
[im 1/7]
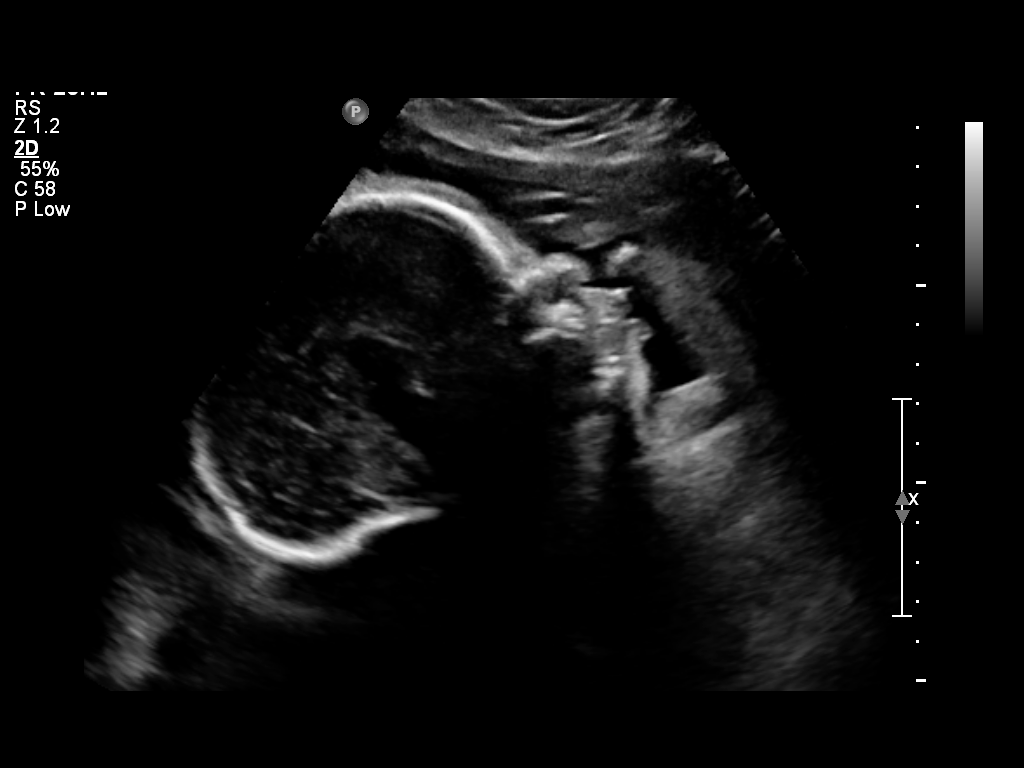
[im 2/7]
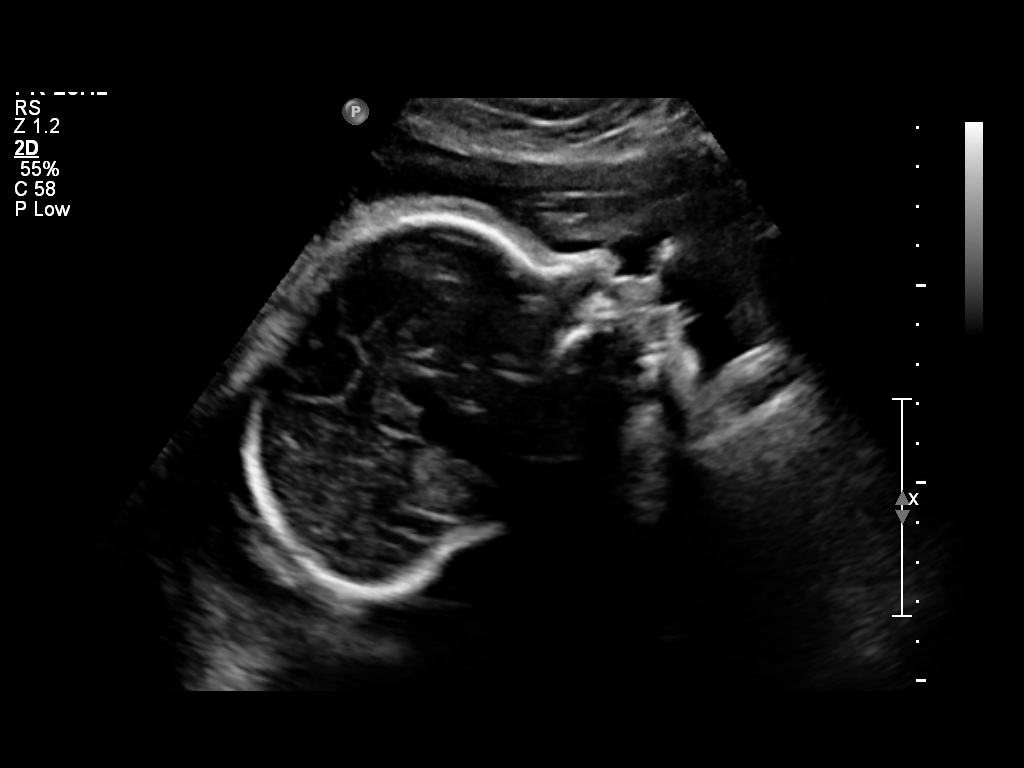
[im 3/7]
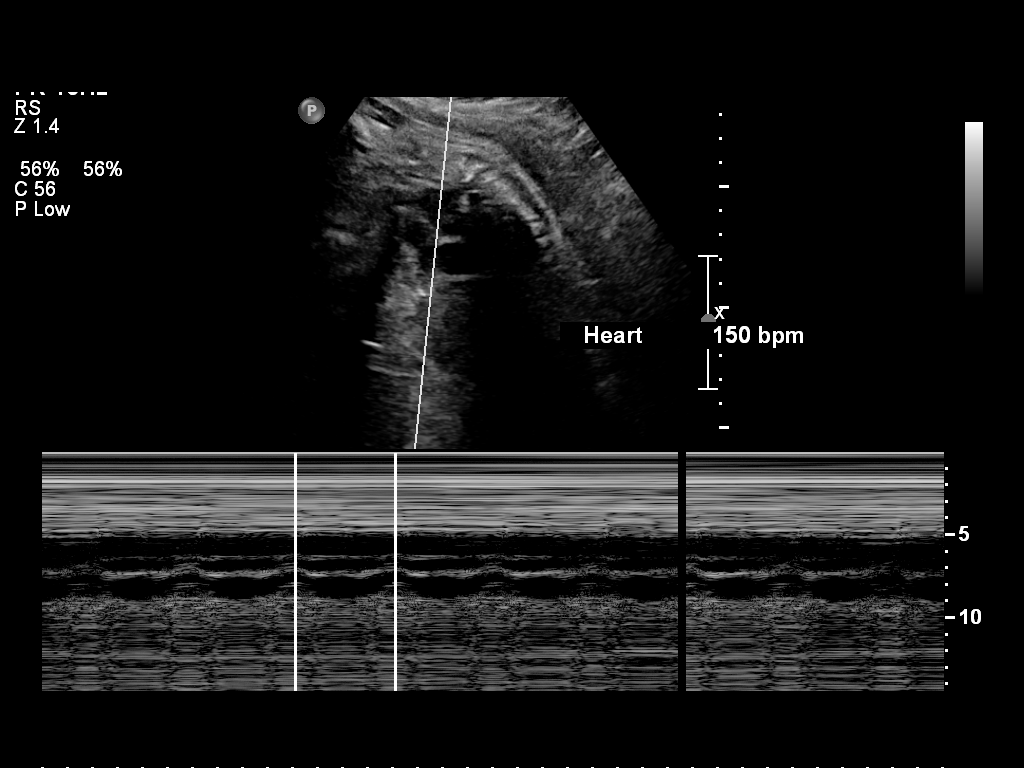
[im 4/7]
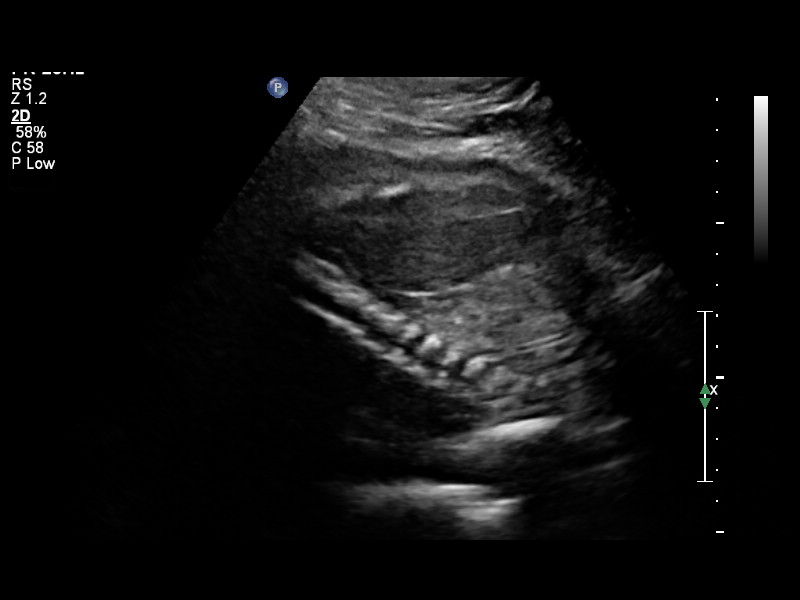
[im 5/7]
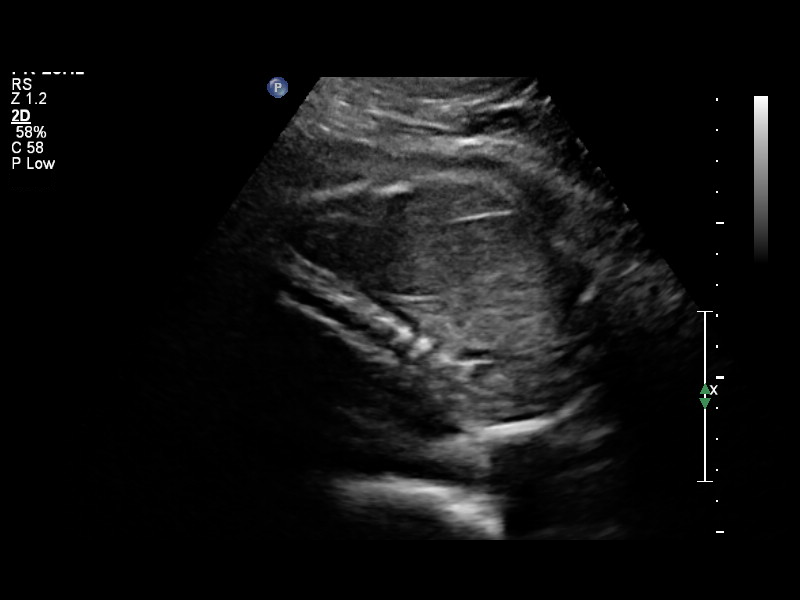
[im 6/7]
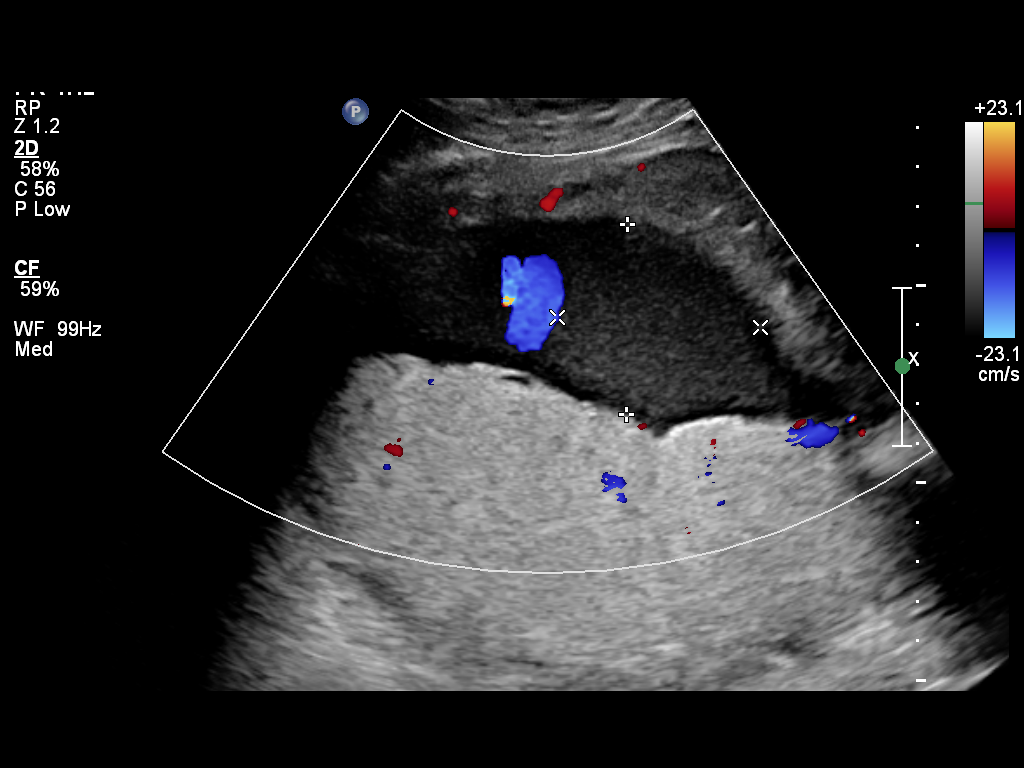
[im 7/7]
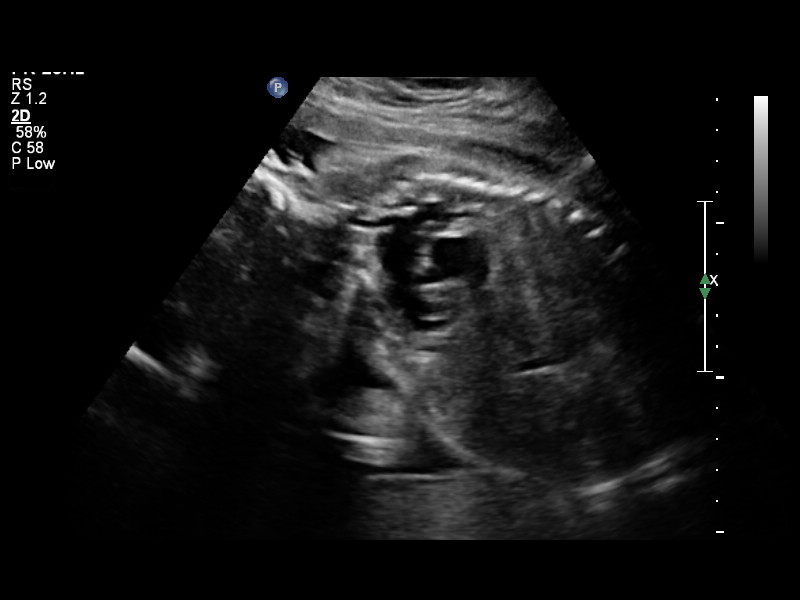

[7 of 7 positions shown; findings below may reference images not displayed]

IMPRESSION: See [facility] Obstetric US report.

## 2011-12-27 IMAGING — US US FETAL BPP W/O NONSTRESS
1 series · 5 of 5 positions shown · non-contrast
Comparison: none

OBSTETRICAL ULTRASOUND:
 This ultrasound exam was performed in the [HOSPITAL] Ultrasound Department.  The OB US report was generated in the [facility] system, and faxed to the ordering physician.  This report is also available in [HOSPITAL]?s Access[employer]ware and in [REDACTED] PACS.

[Series 1: us fetal bpp w/o nonstress · non-contrast · 5 of 5 slices shown]
[im 1/5]
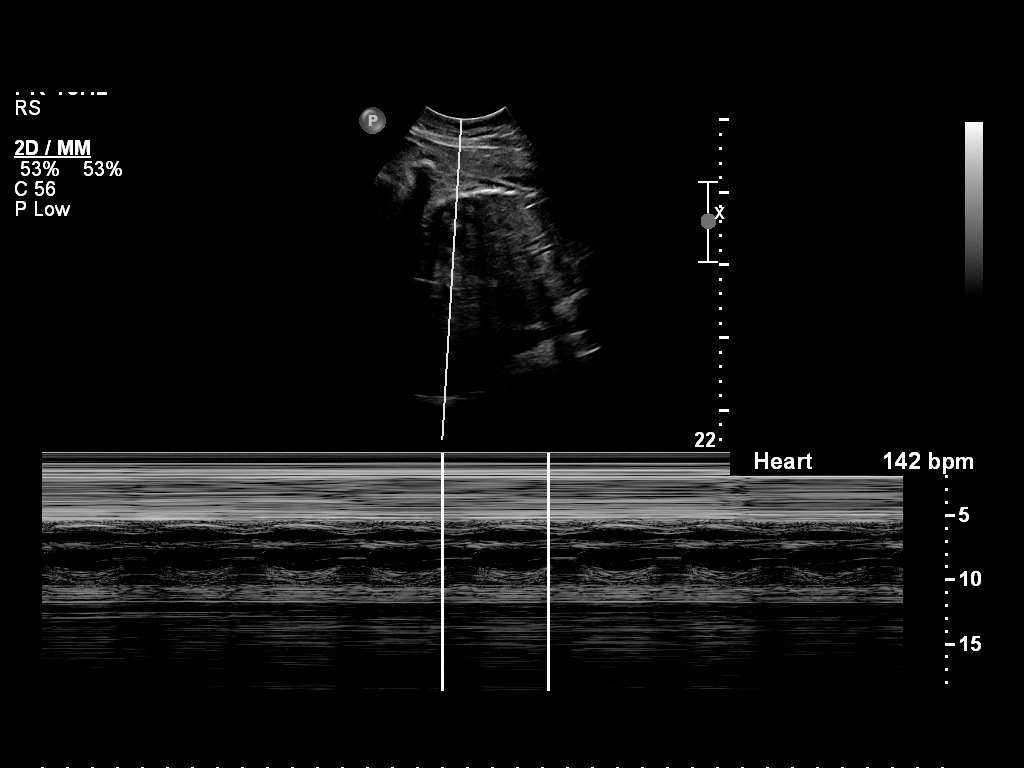
[im 2/5]
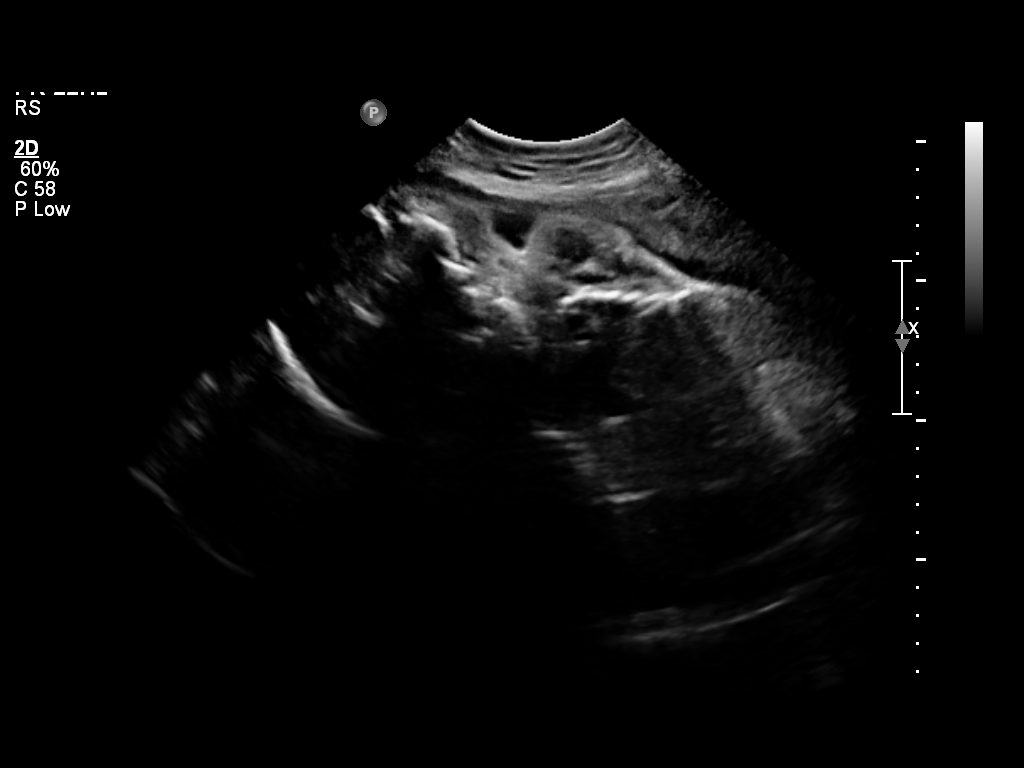
[im 3/5]
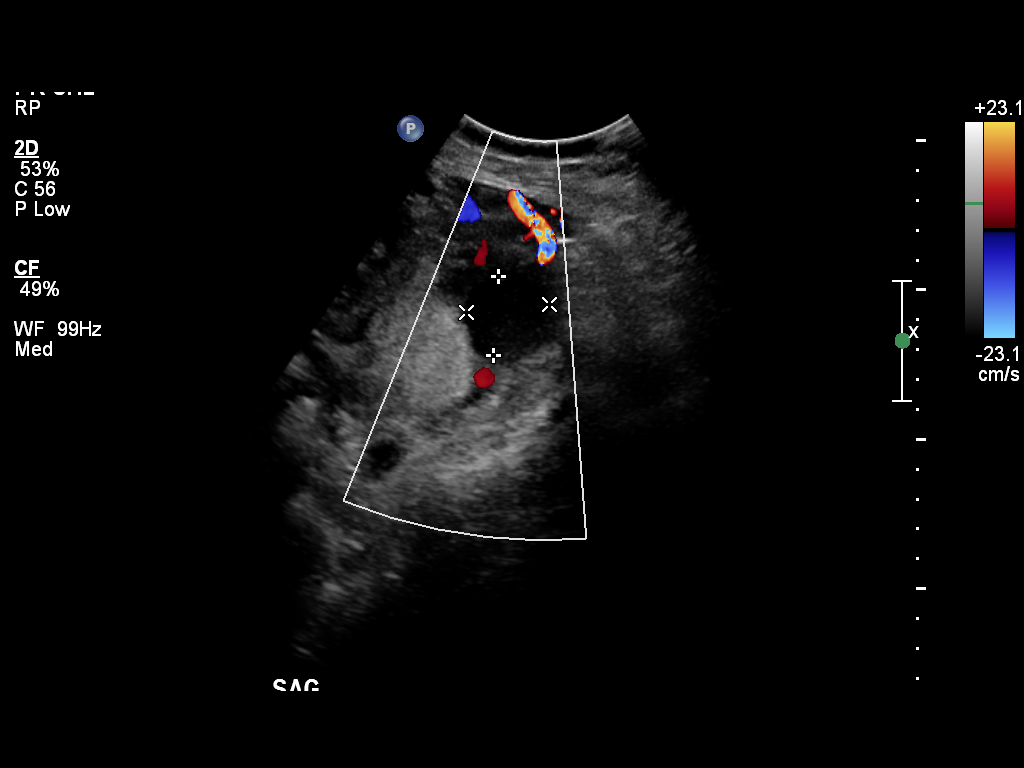
[im 4/5]
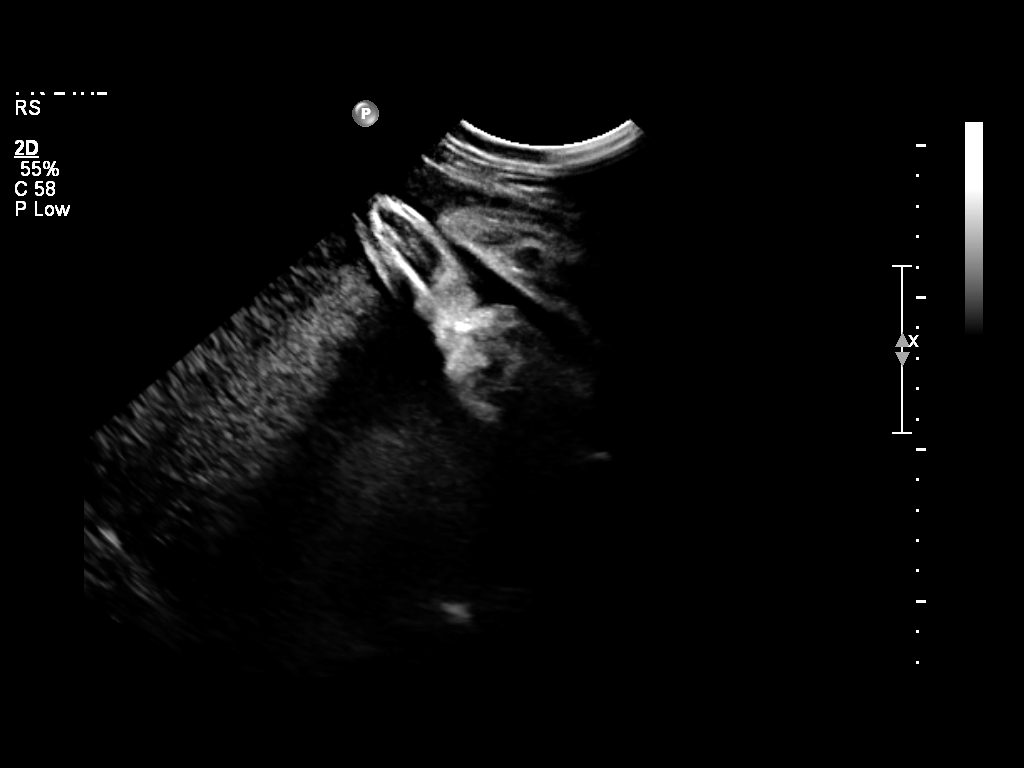
[im 5/5]
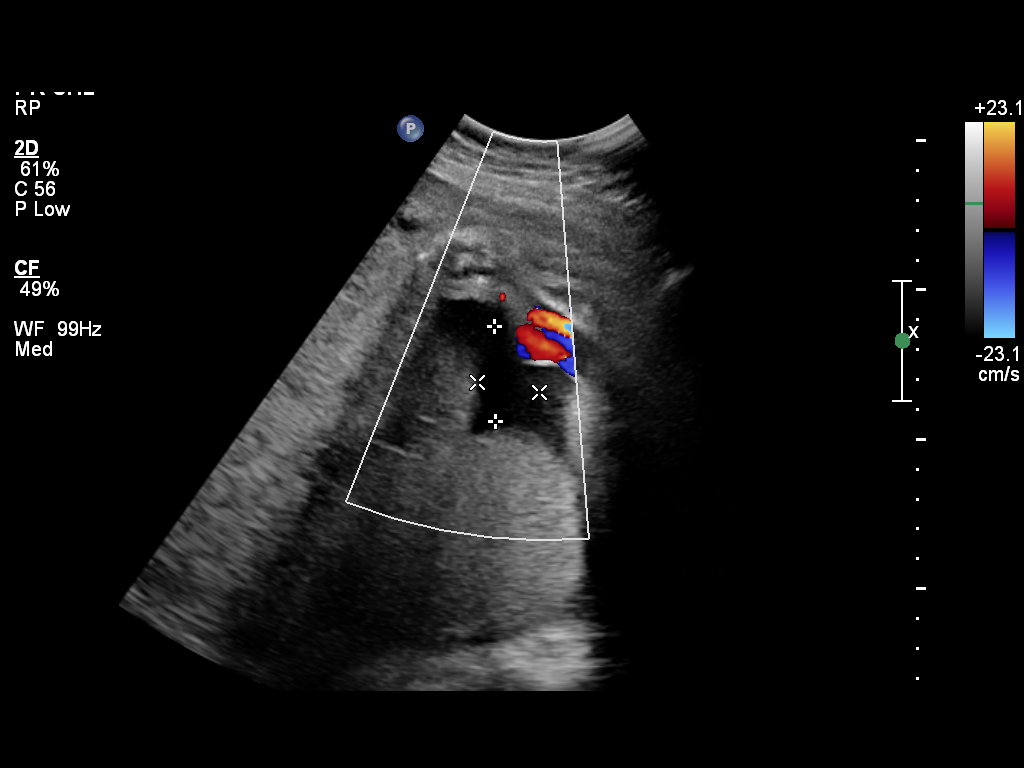

[5 of 5 positions shown; findings below may reference images not displayed]

IMPRESSION: See AS Obstetric US report.

## 2011-12-28 IMAGING — US US FETAL BPP W/O NONSTRESS
1 series · 3 of 3 positions shown · non-contrast
Comparison: none

OBSTETRICAL ULTRASOUND:
 This ultrasound exam was performed in the [HOSPITAL] Ultrasound Department.  The OB US report was generated in the [facility] system, and faxed to the ordering physician.  This report is also available in [HOSPITAL]?s Access[employer] and in [REDACTED] PACS.

[Series 1: us fetal bpp w/o nonstress · non-contrast · 3 of 3 slices shown]
[im 1/3]
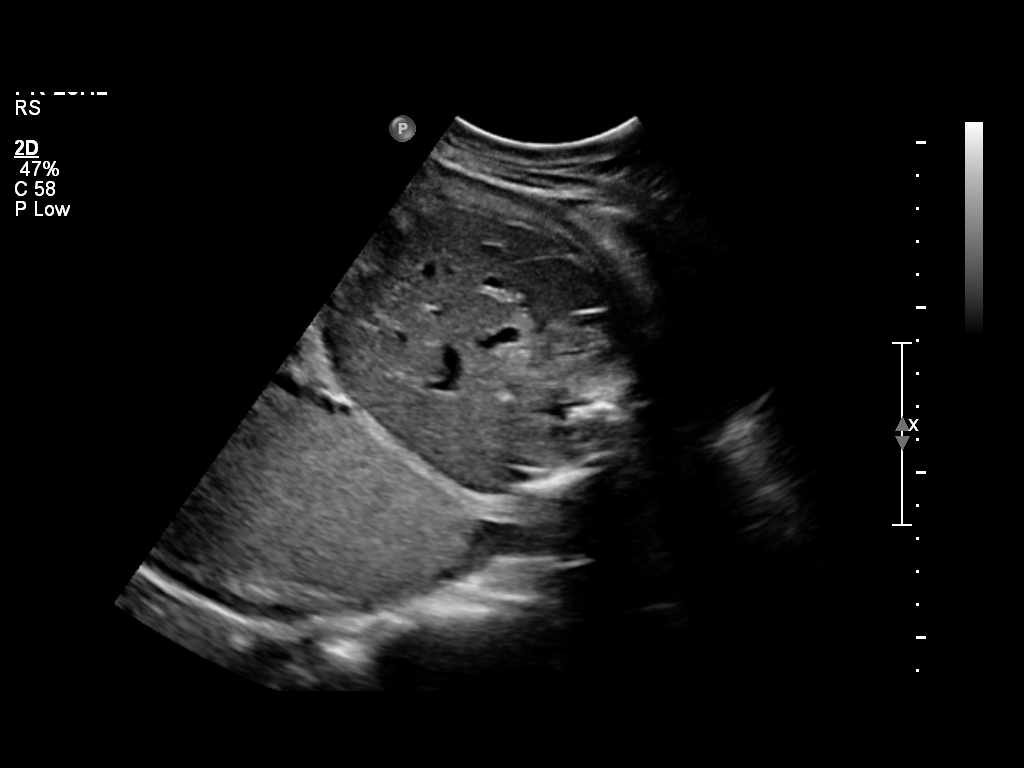
[im 2/3]
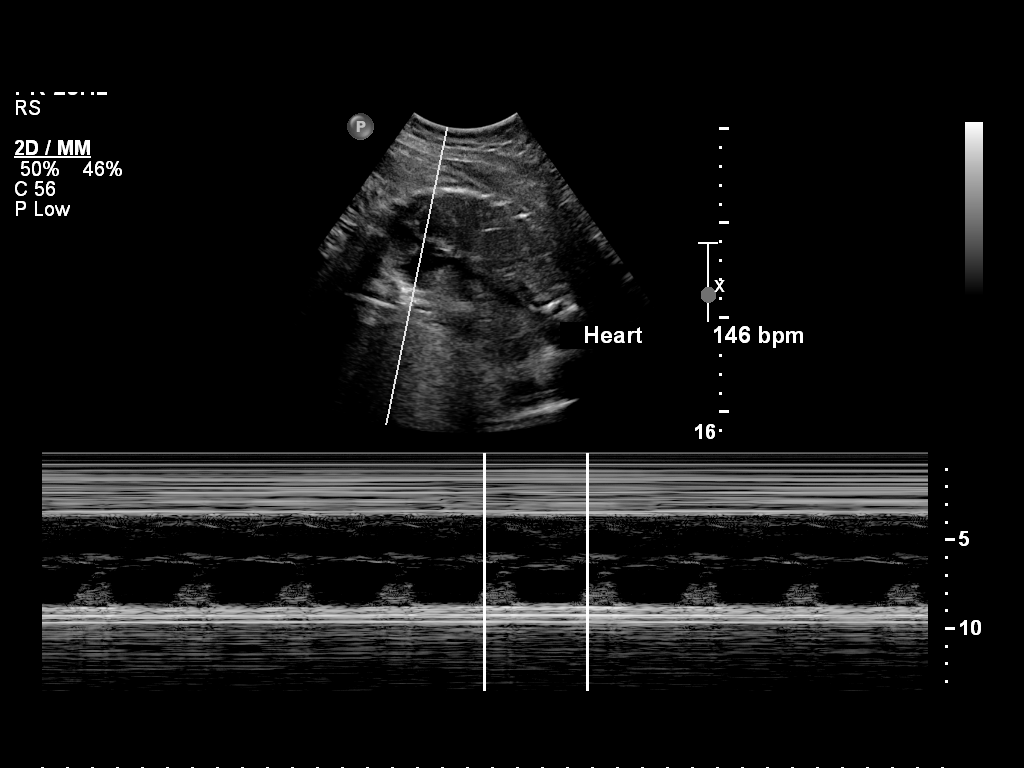
[im 3/3]
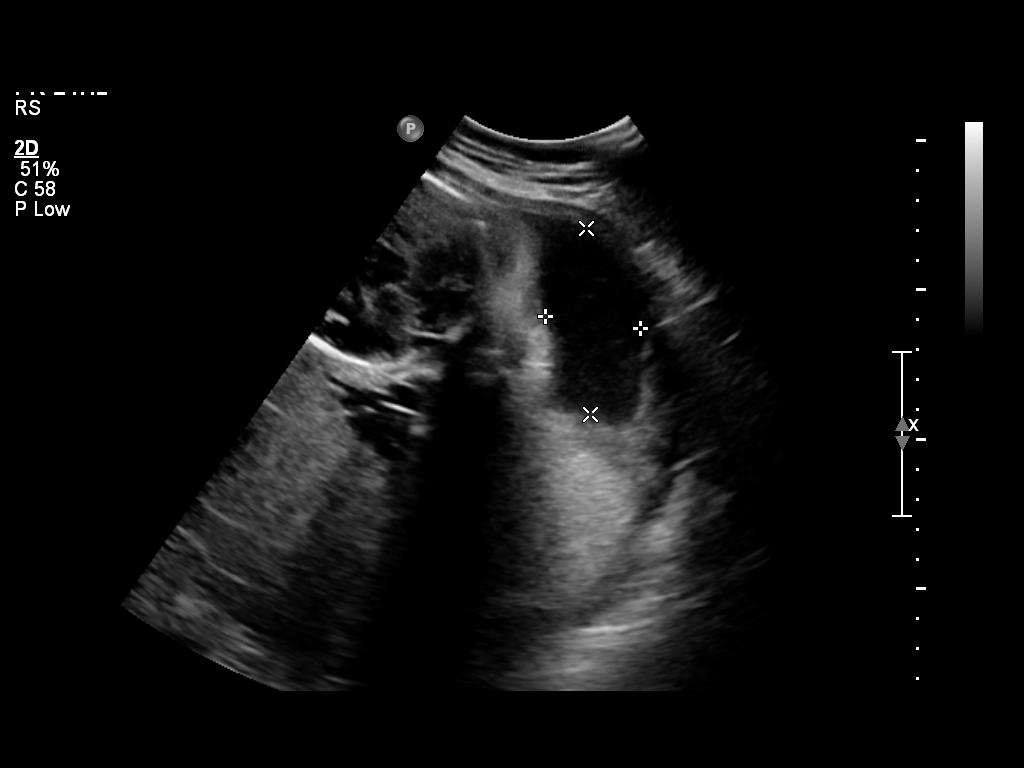

[3 of 3 positions shown; findings below may reference images not displayed]

IMPRESSION: See [facility] Obstetric US report.

## 2011-12-28 IMAGING — US US FETAL ECHO W/PULSED OR C/W - NRPT [facility]
1 series · 14 of 18 positions shown · non-contrast
Comparison: none

[Series 1: us fetal echo w/pulsed or c/w · 18 acquisitions, 14 frames shown]
[im 1/18]
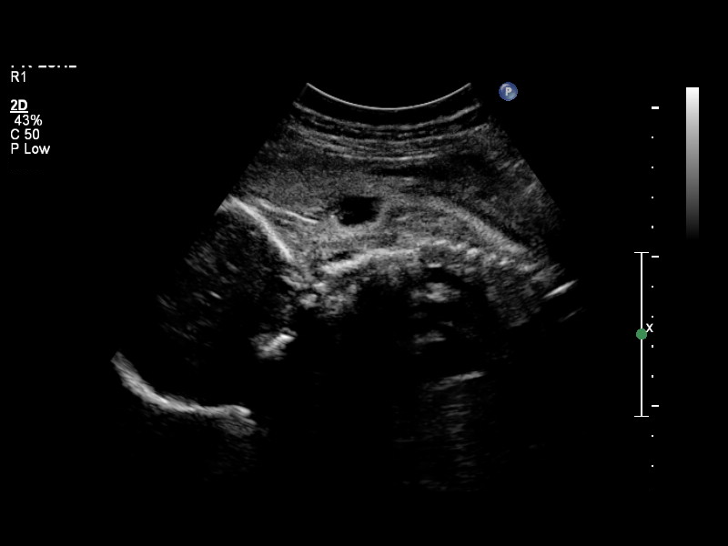
[im 2/18]
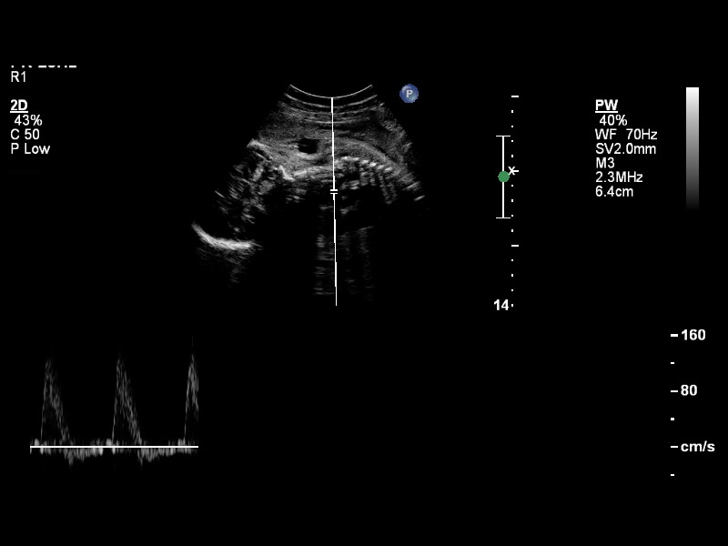
[im 4/18]
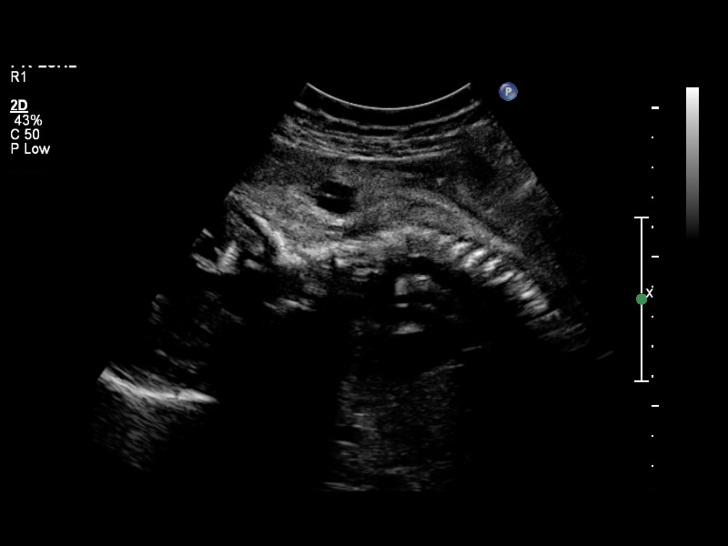
[im 5/18]
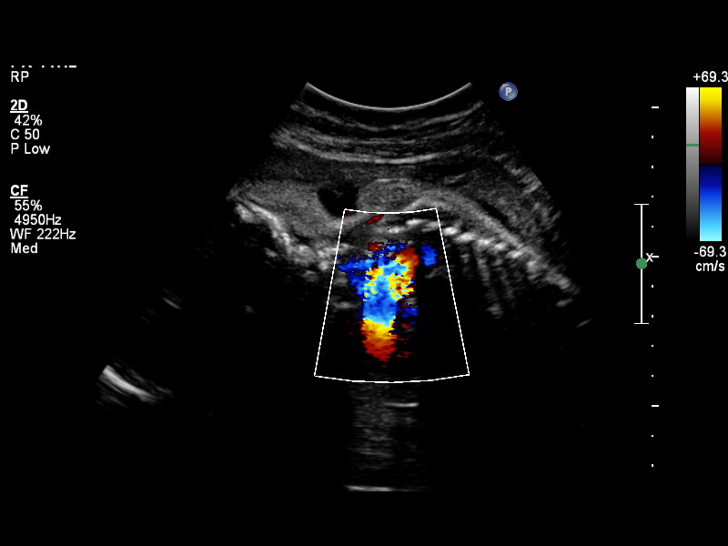
[im 6/18]
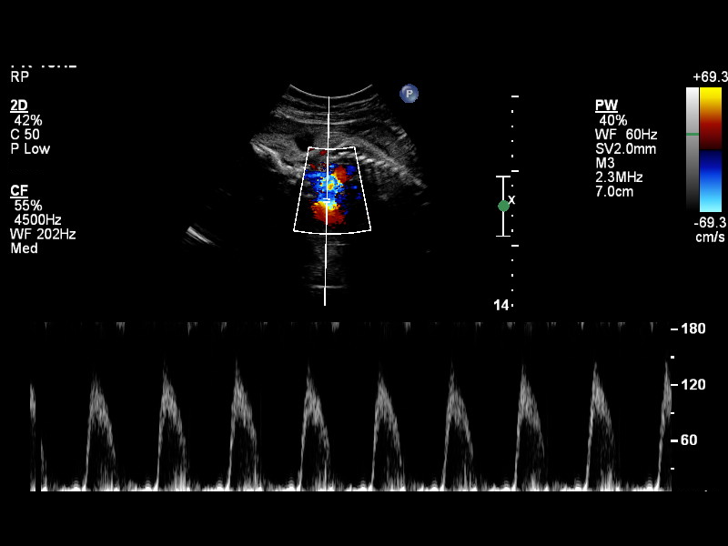
[im 8/18]
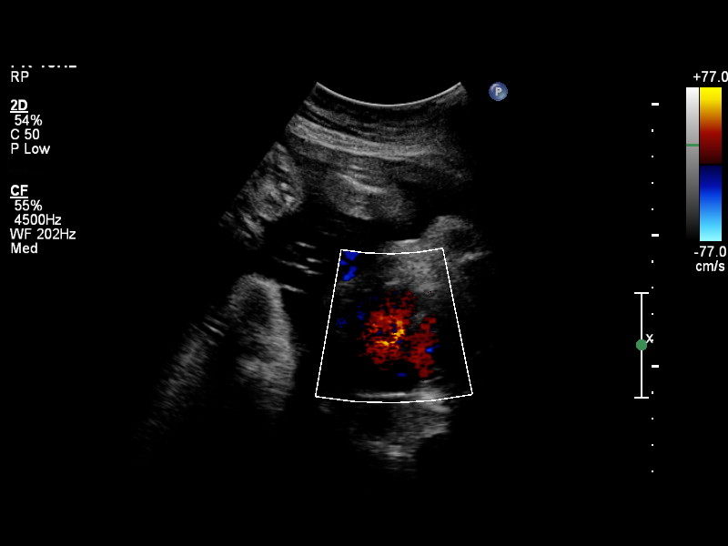
[im 9/18]
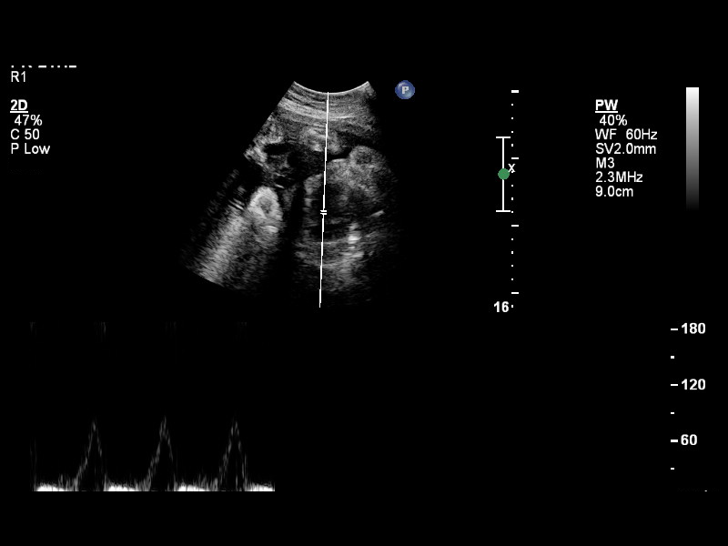
[im 10/18]
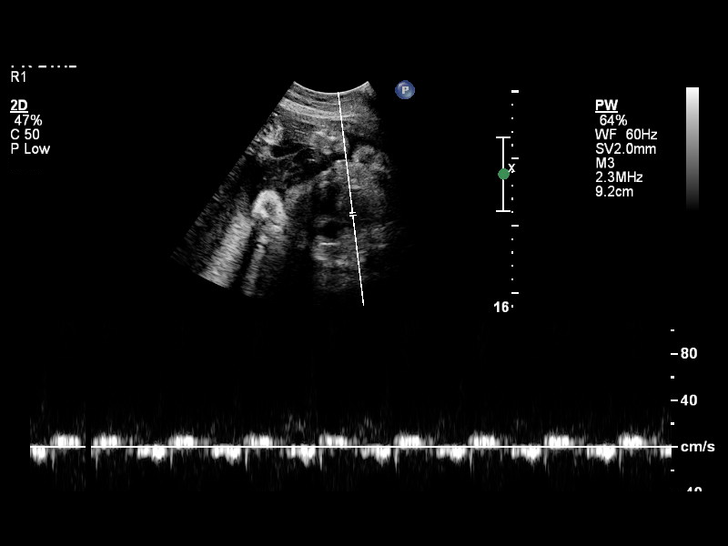
[im 11/18]
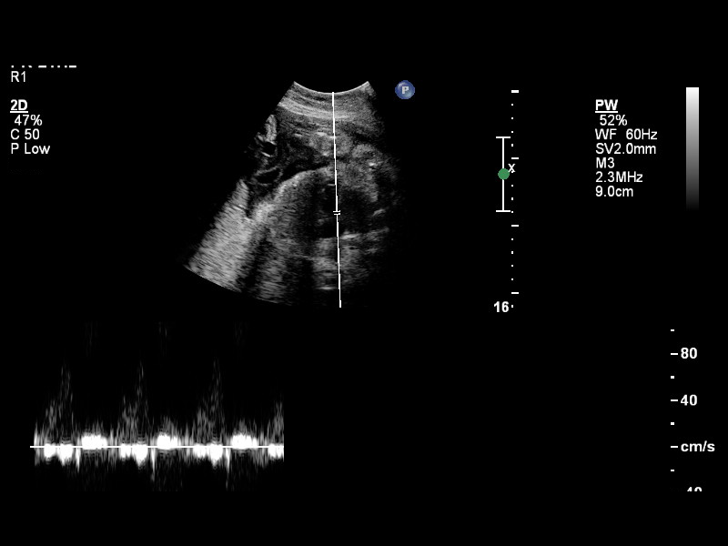
[im 13/18]
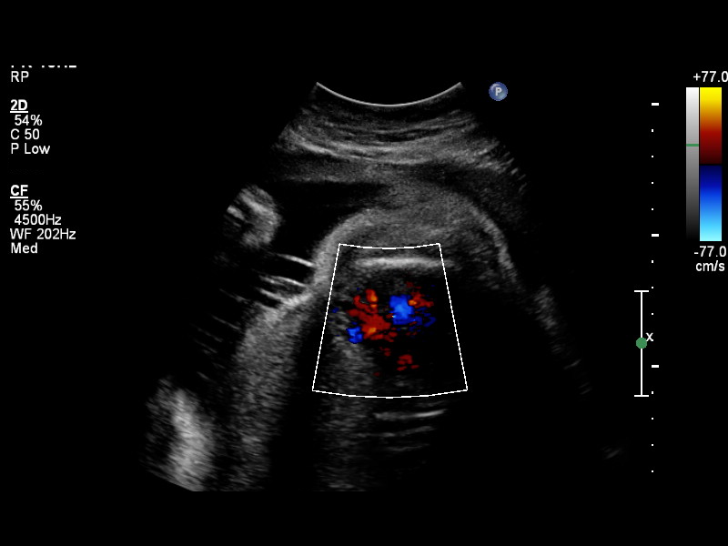
[im 14/18]
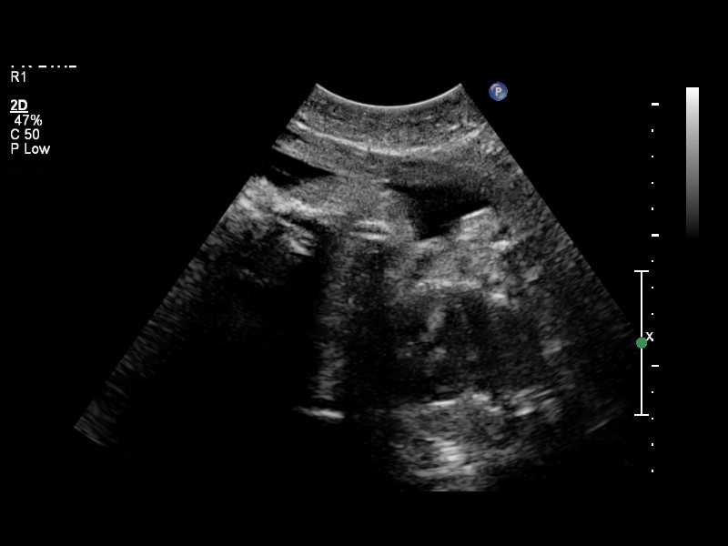
[im 15/18]
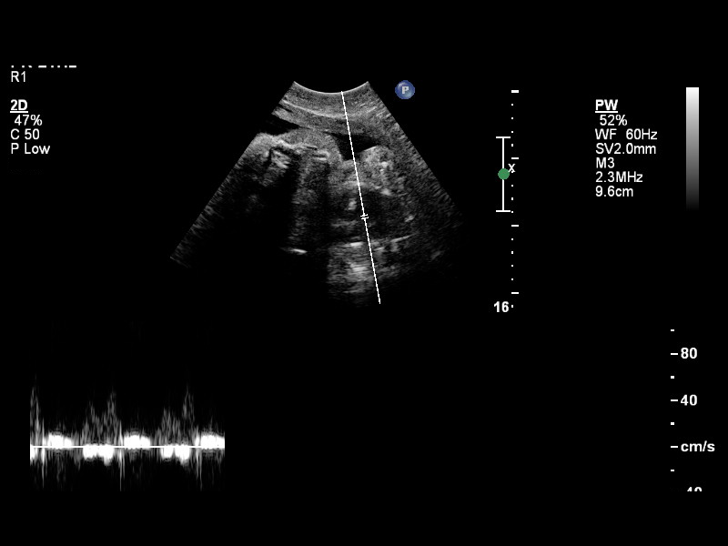
[im 17/18]
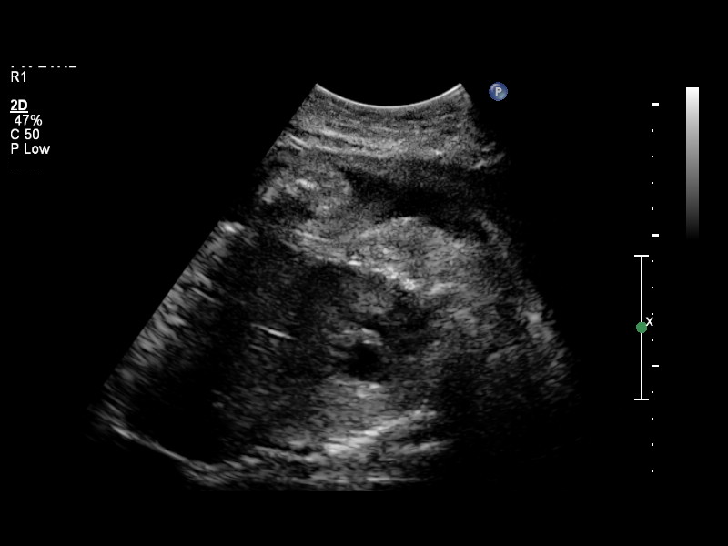
[im 18/18]
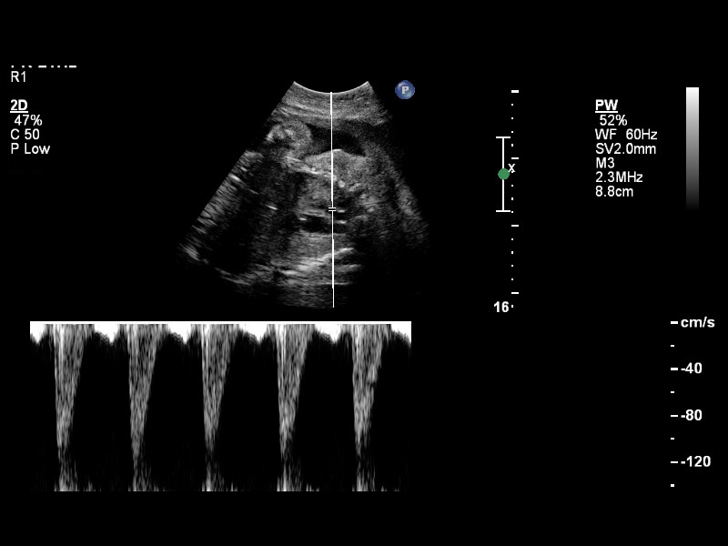

[14 of 18 positions shown; findings below may reference images not displayed]

THIS REPORT IS TO BE TRANSCRIBED BY A CARDIOLOGIST.

## 2011-12-31 IMAGING — US US UA DOPPLER RE-EVAL
1 series · 9 of 9 positions shown · non-contrast
Comparison: none

OBSTETRICAL ULTRASOUND:
 This ultrasound exam was performed in the [HOSPITAL] Ultrasound Department.  The OB US report was generated in the [facility] system, and faxed to the ordering physician.  This report is also available in [HOSPITAL]?s Access[employer] and in [REDACTED] PACS.

[Series 1: us fetal bpp w/o nonstress · non-contrast · 0.30mm/px · 9 acquisitions, 9 frames shown]
[im 1/9]
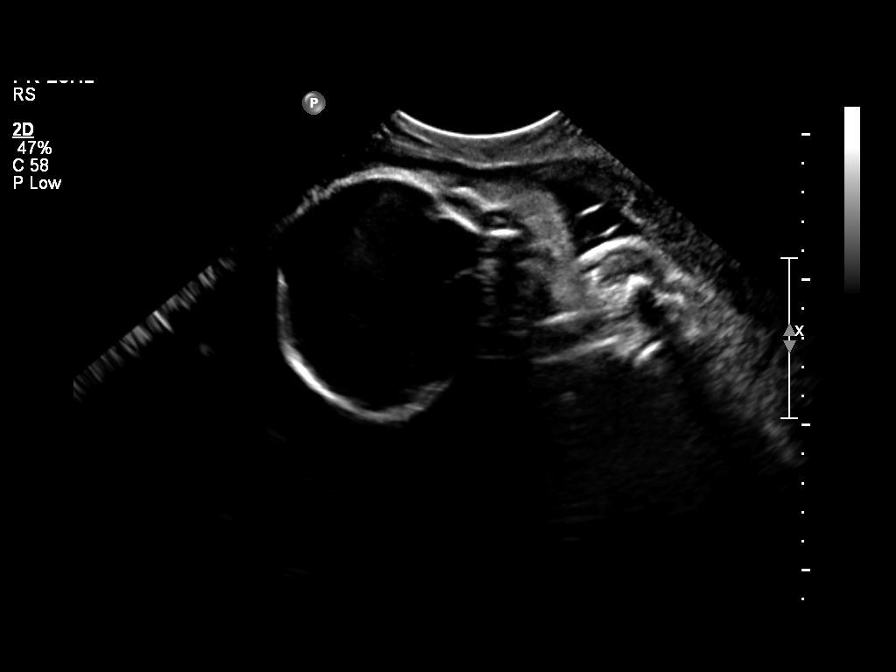
[im 2/9]
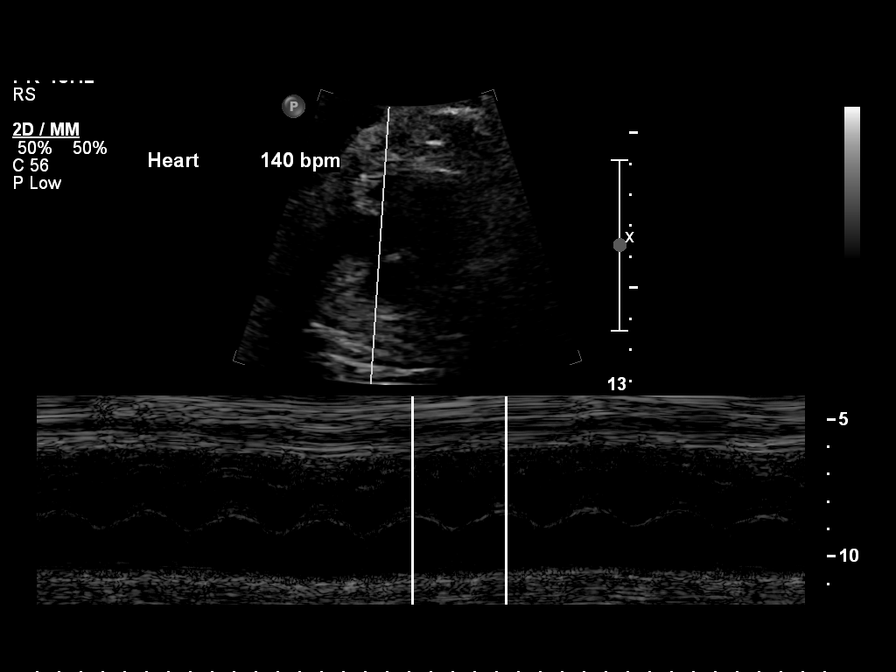
[im 3/9]
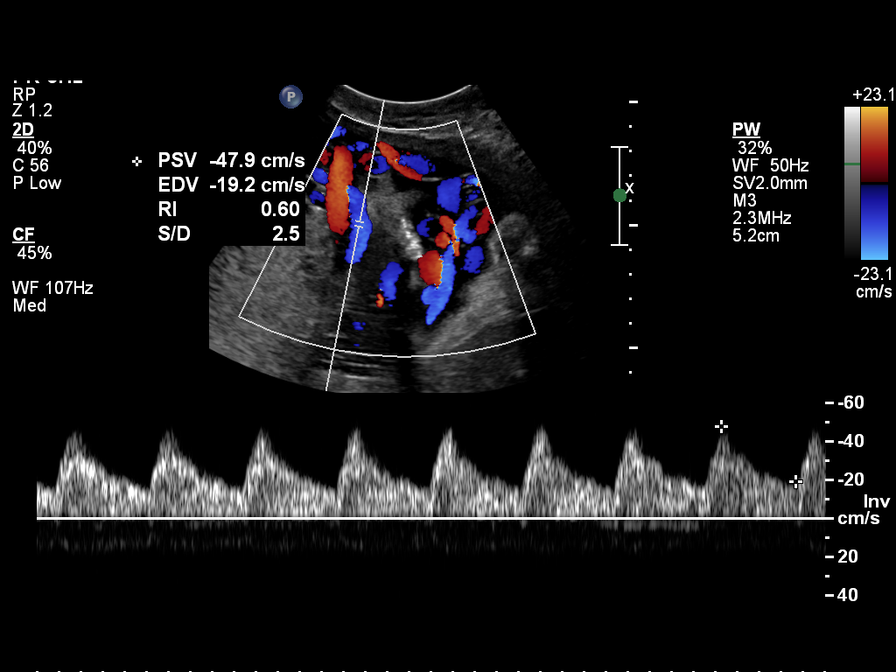
[im 4/9]
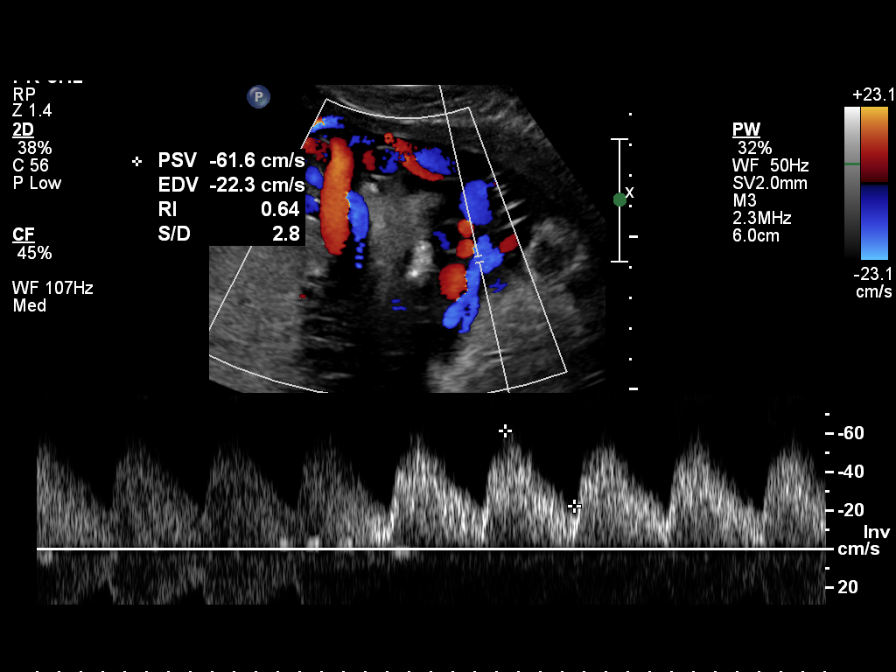
[im 5/9]
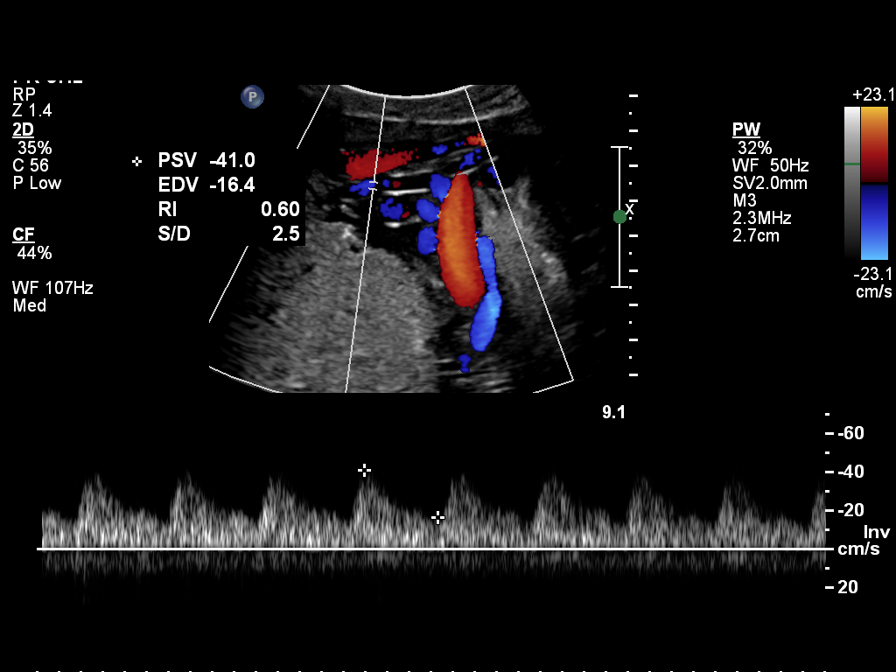
[im 6/9]
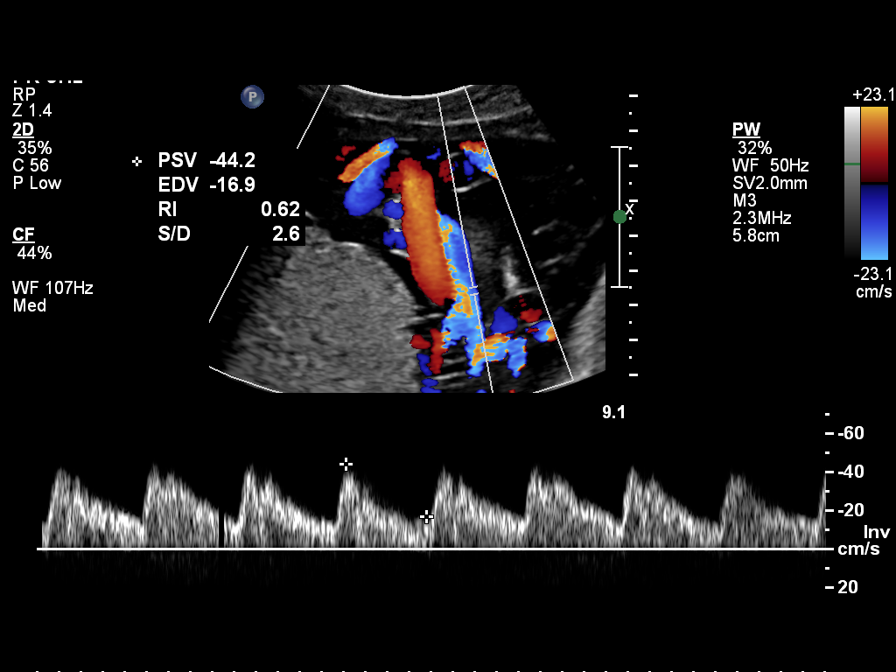
[im 7/9]
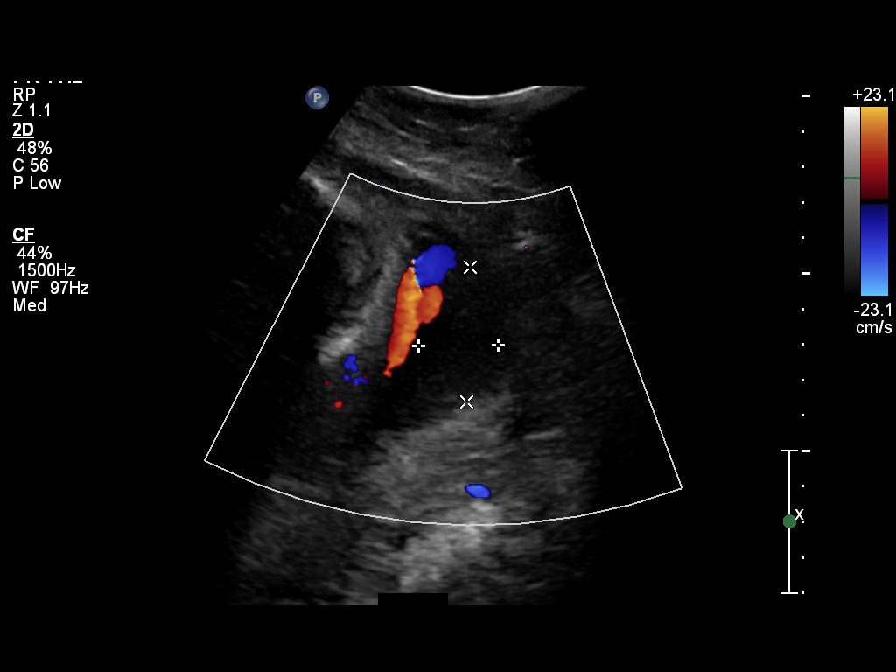
[im 8/9]
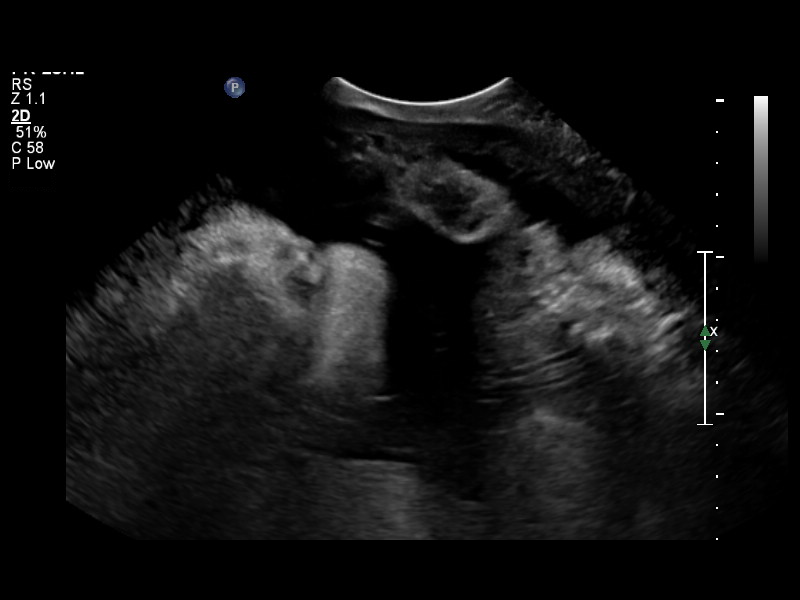
[im 9/9]
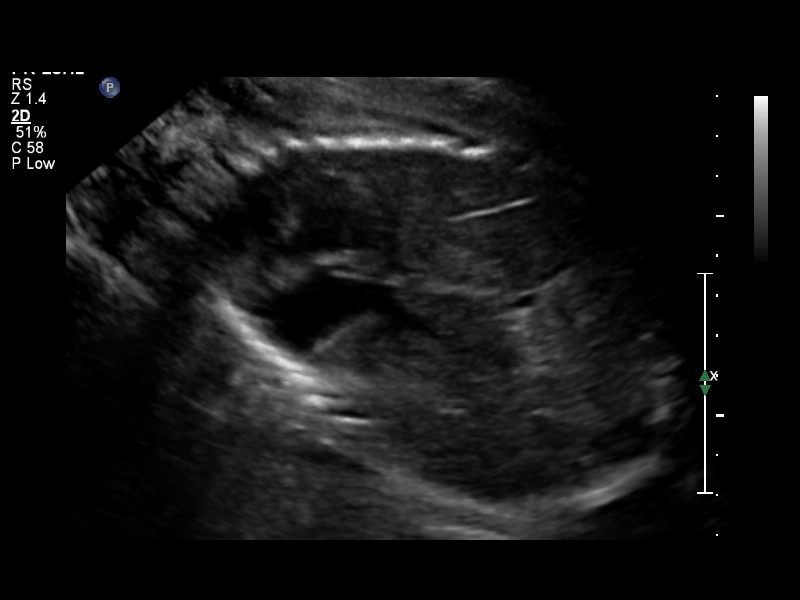

[9 of 9 positions shown; findings below may reference images not displayed]

IMPRESSION: See [facility] Obstetric US report.

## 2012-01-01 IMAGING — CR DG CHEST 2V
2 series · 2 of 2 positions shown · non-contrast
Comparison: None.

CLINICAL DATA: 32 weeks pregnant.  Cough and congestion.

CHEST - 2 VIEW

[view not recorded (1 of 2)]
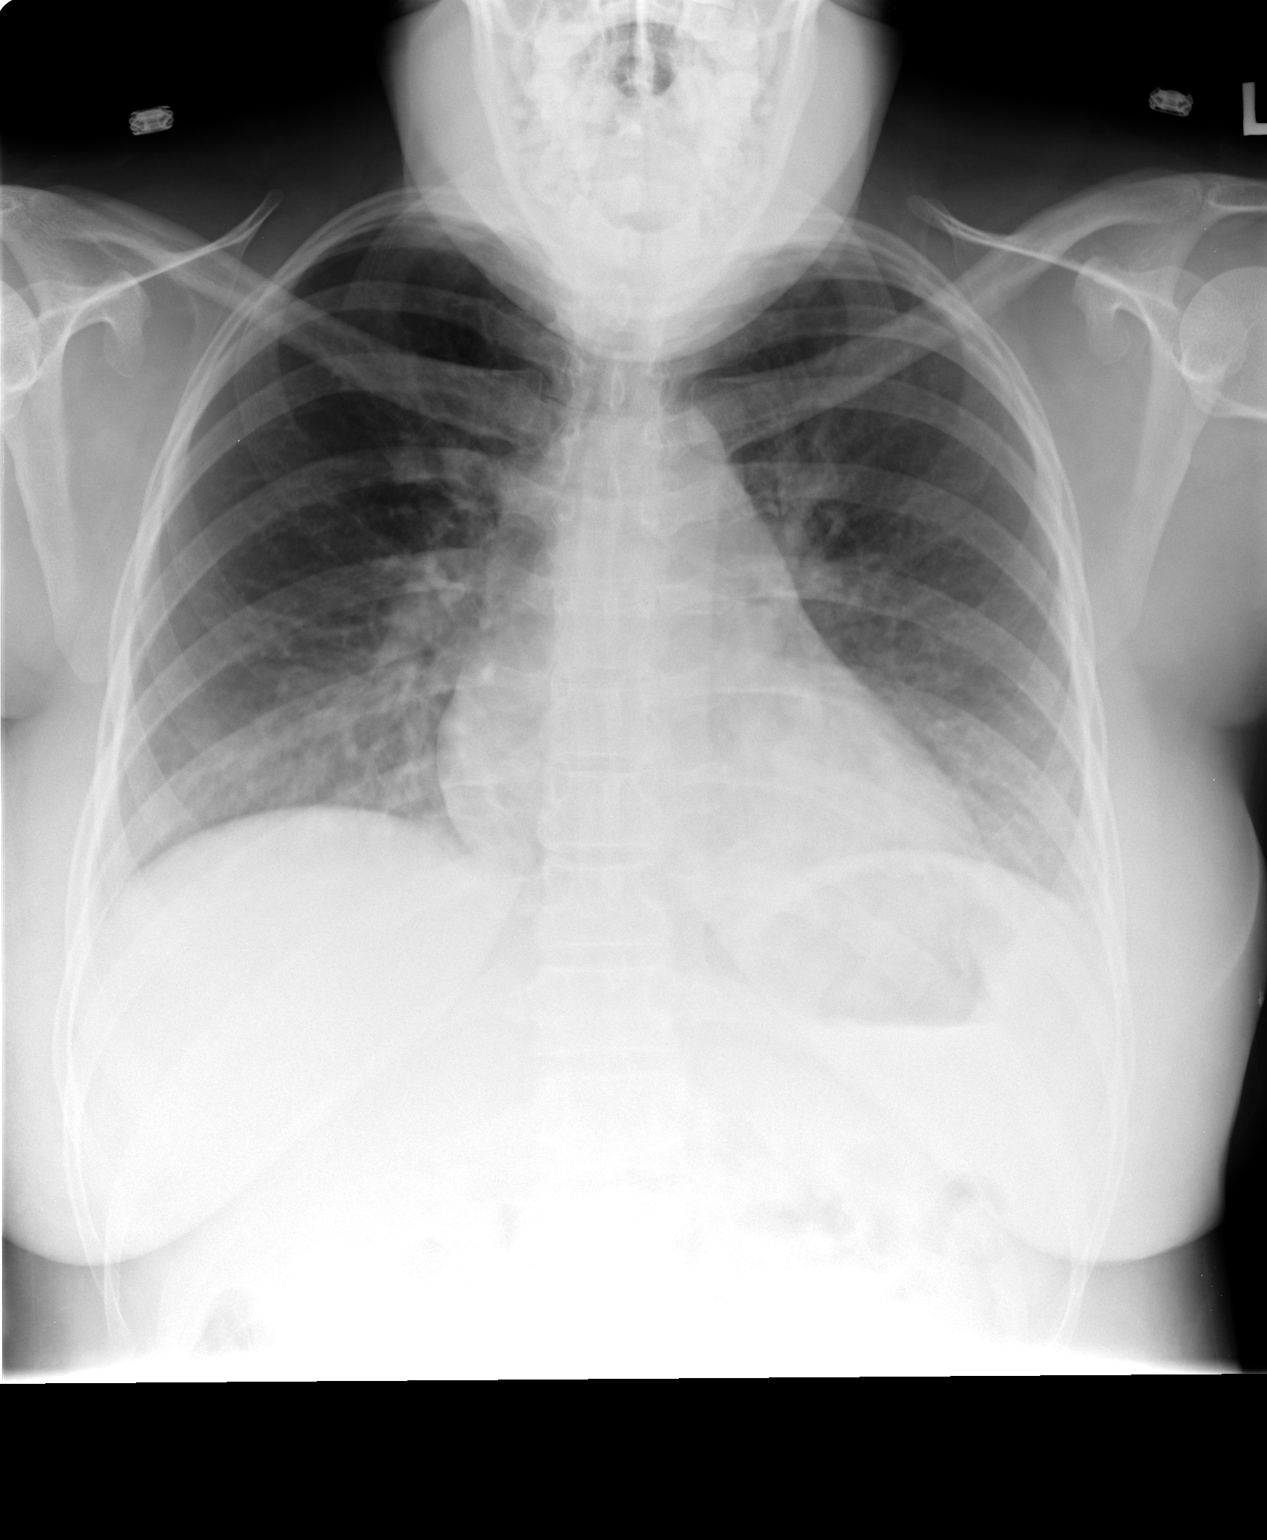

[view not recorded (2 of 2)]
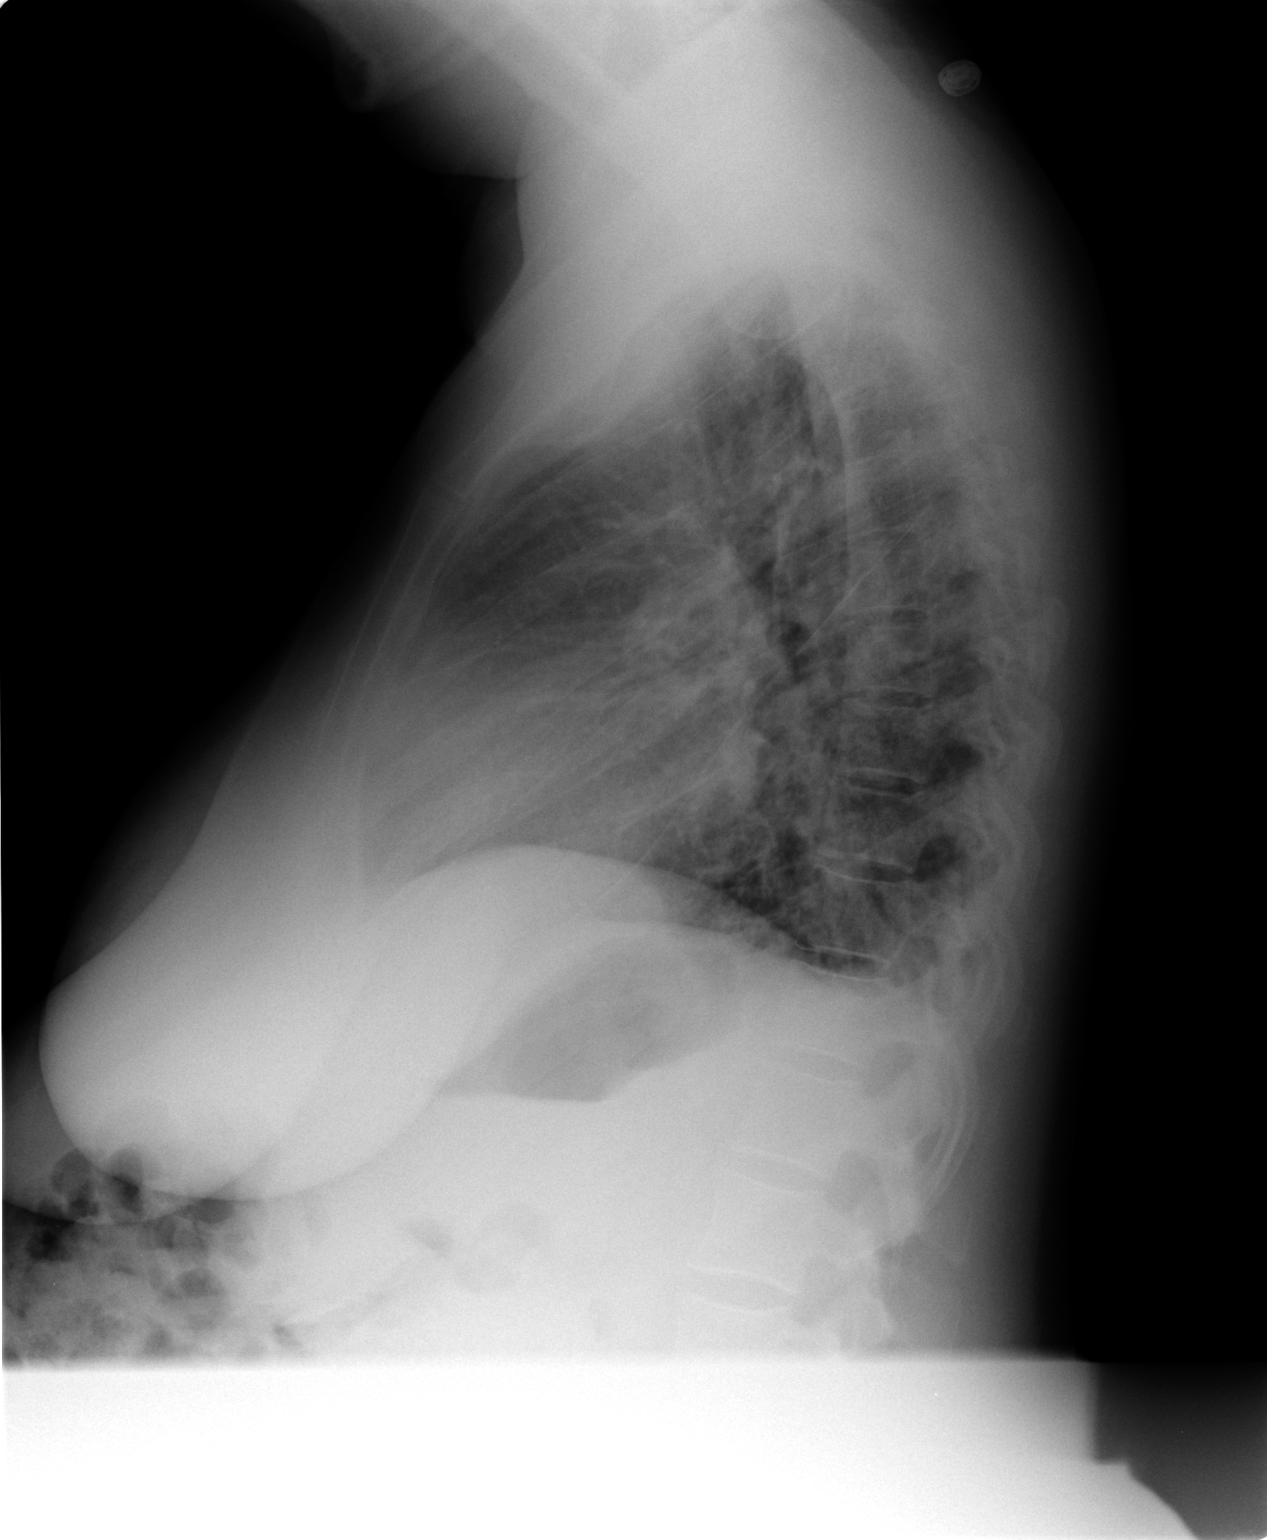

[2 of 2 positions shown; findings below may reference images not displayed]

FINDINGS: The cardiopericardial silhouette is mildly enlarged.
Small bilateral pleural effusions are present.  Mild edema is also
noted.  Minimal bibasilar airspace disease likely reflects
atelectasis.  The upper lungs are clear. The visualized soft
tissues and bony thorax are unremarkable.
IMPRESSION: 1.  Cardiomegaly with mild edema and bilateral pleural effusions
likely reflects some degree of pulmonary congestion.  Is unclear if
this represents congestive heart failure or volume overload related
pregnancy.

## 2012-09-11 IMAGING — NM NM RAI THERAPY FOR HYPERTHYROIDISM
1 series · 1 of 1 positions shown · non-contrast
Comparison: None.

CLINICAL DATA: Hyperthyroidism

NUCLEAR MEDICINE RADIOACTIVE IODINE THERAPY FOR HYPERTHYROIDISM
TECHNIQUE: The risks and benefits of radioactive iodine therapy
were discussed with the patient in detail. Alternative therapies
were also mentioned. Radiation safety was discussed with the
patient, including how to protect the general public from exposure.
There were no barriers to communication.  Written consent was
obtained.  The patient then received a capsule containing the
radiopharmaceutical.  The patient will follow-up with the referring
physician.
Radiopharmaceutical: 43.5 mCi I 131, administered orally

[st static image · 1 of 1 slices shown]
[im 1/1]
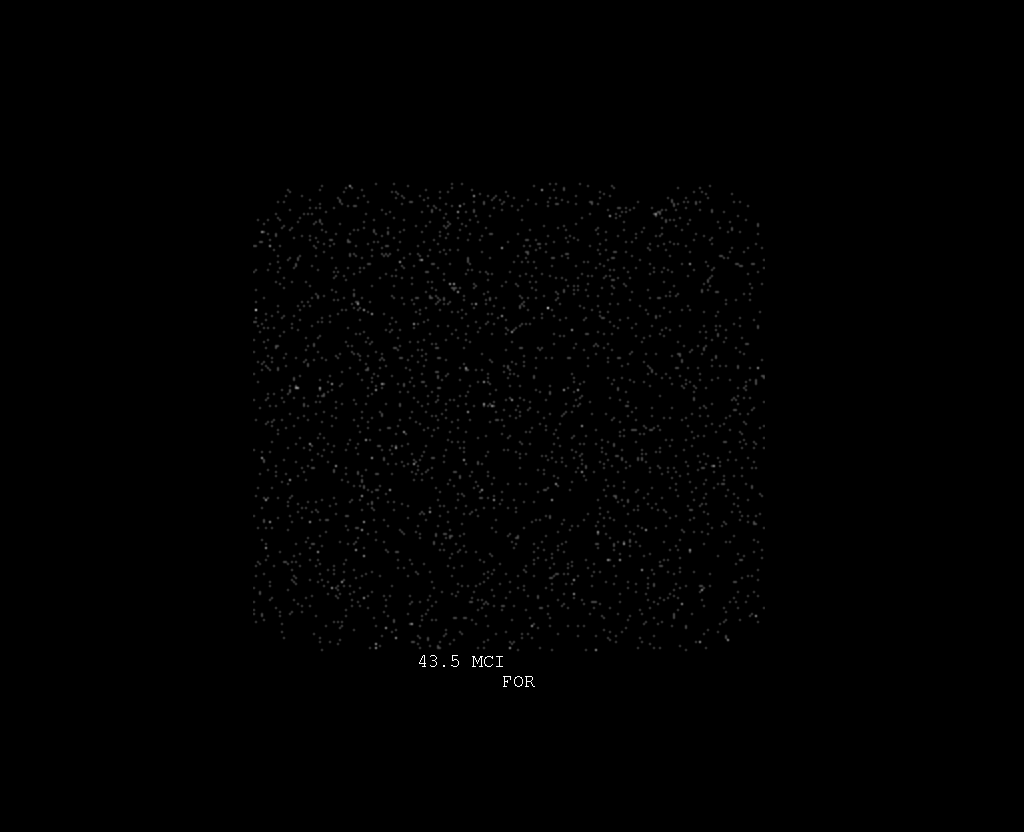

[1 of 1 positions shown; findings below may reference images not displayed]

IMPRESSION: Oral I 131 therapy for hyperthyroidism as above.

## 2012-09-15 ENCOUNTER — Encounter (HOSPITAL_COMMUNITY): Payer: Self-pay | Admitting: Emergency Medicine

## 2012-09-15 ENCOUNTER — Emergency Department (HOSPITAL_COMMUNITY)
Admission: EM | Admit: 2012-09-15 | Discharge: 2012-09-15 | Disposition: A | Discharge status: Home / Self Care | Payer: Managed Care, Other (non HMO) | Source: Home / Self Care | Attending: Family Medicine | Admitting: Family Medicine

## 2012-09-15 DIAGNOSIS — B9789 Other viral agents as the cause of diseases classified elsewhere: Secondary | ICD-10-CM

## 2012-09-15 DIAGNOSIS — B349 Viral infection, unspecified: Secondary | ICD-10-CM

## 2012-09-15 MED ORDER — BENZOCAINE 20 % MT GEL: 1.0000 "application " | Freq: Three times a day (TID) | OROMUCOSAL | Status: DC | PRN

## 2012-09-15 MED ORDER — HYDROXYZINE HCL 25 MG PO TABS: 25.0000 mg | ORAL_TABLET | Freq: Three times a day (TID) | ORAL | Status: DC | PRN

## 2012-09-15 MED ORDER — IBUPROFEN 600 MG PO TABS: 600.0000 mg | ORAL_TABLET | Freq: Three times a day (TID) | ORAL | Status: DC | PRN

## 2012-09-15 MED ORDER — MUPIROCIN 2 % EX OINT: TOPICAL_OINTMENT | Freq: Three times a day (TID) | CUTANEOUS | Status: DC

## 2012-09-15 NOTE — ED Provider Notes (Signed)
CSN: [id number]     Arrival date & time 09/15/12  1141 History   First MD Initiated Contact with Patient 09/15/12 1236     Chief Complaint  Patient presents with  . Sore Throat   (Consider location/radiation/quality/duration/timing/severity/associated sxs/prior Treatment) HPI Comments: 37 year old female with history of hypothyroidism among other comorbidities. Here complaining of 3 days history of nasal congestion subjective fever and chills associated with a rash  in the face and hands. Also has presented with sore throat. Reports arises pruriginous. Denies cough or chest pain. Patient reports that she has a 39-year-old son that had similar symptoms last week and are now almost completely resolved. Patient taking Tylenol with no significant improvement.    Past Medical History  Diagnosis Date  . Sickle cell trait   . History of congestive heart failure     with pulm edema, hyperthyroid dx  . Hypothyroidism   . Hyperthyroidism     treated with radioactive iodine now hypothyroid  . IBS (irritable bowel syndrome)   . Abnormal Pap smear    Past Surgical History  Procedure Laterality Date  . Cesarean section    . Dilation and curettage of uterus    . Gynecologic cryosurgery     Family History  Problem Relation Age of Onset  . Arthritis Mother   . Hypertension Mother   . Sickle cell trait Mother   . Diabetes Father   . Hypertension Father   . Heart attack Father   . Sickle cell trait Brother   . Cancer Maternal Aunt     breast  . Cancer Maternal Grandmother     breast  . Cancer Paternal Grandmother    History  Substance Use Topics  . Smoking status: Never Smoker   . Smokeless tobacco: Never Used  . Alcohol Use: No   OB History   Grav Para Term Preterm Abortions TAB SAB Ect Mult Living   4 3 2 1 1  1   2      Review of Systems  Constitutional: Positive for chills and appetite change. Negative for diaphoresis.  HENT: Positive for congestion, sore throat and rhinorrhea.  Negative for neck pain and neck stiffness.   Eyes: Negative for discharge and redness.  Respiratory: Negative for shortness of breath and wheezing.   Cardiovascular: Negative for chest pain.  Gastrointestinal: Negative for nausea, vomiting, abdominal pain and diarrhea.  Genitourinary: Negative for dysuria and hematuria.  Skin: Positive for rash.  Neurological: Positive for headaches. Negative for dizziness.  All other systems reviewed and are negative.    Allergies  Review of patient's allergies indicates no known allergies.  Home Medications   Current Outpatient Rx  Name  Route  Sig  Dispense  Refill  . benzocaine (HURRICAINE) 20 % GEL   Mouth/Throat   Use as directed 1 application in the mouth or throat 3 (three) times daily as needed.   30 g   0   . hydrOXYzine (ATARAX/VISTARIL) 25 MG tablet   Oral   Take 1 tablet (25 mg total) by mouth every 8 (eight) hours as needed for itching.   21 tablet   0     (Atarax)   . ibuprofen (ADVIL,MOTRIN) 600 MG tablet   Oral   Take 1 tablet (600 mg total) by mouth every 8 (eight) hours as needed for pain or fever (take with meals).   30 tablet   0   . levothyroxine (SYNTHROID, LEVOTHROID) 100 MCG tablet   Oral   Take 100  mcg by mouth daily.         . mupirocin ointment (BACTROBAN) 2 %   Topical   Apply topically 3 (three) times daily.   22 g   0   . Prenatal Vit-Fe Fumarate-FA (PRENATAL MULTIVITAMIN) TABS   Oral   Take 1 tablet by mouth daily.          BP 144/92  Pulse 88  Temp(Src) 98.1 F (36.7 C) (Oral)  Resp 16  SpO2 100%  LMP 08/19/2012  Breastfeeding? Unknown Physical Exam  Nursing note and vitals reviewed. Constitutional: She is oriented to person, place, and time. She appears well-developed and well-nourished. No distress.  HENT:  Head: Normocephalic and atraumatic.  Right Ear: External ear normal.  Left Ear: External ear normal.  Nasal Congestion with erythema and swelling of nasal turbinates, clear  rhinorrhea. Significant pharyngeal erythema with papular rash in soft palate and pharyng, no exudates. No uvula deviation. No trismus. TM's with increased vascular markings and some dullness bilaterally no swelling or bulging  Eyes: Conjunctivae are normal. No scleral icterus.  Neck: Neck supple.  Cardiovascular: Normal rate, regular rhythm, normal heart sounds and intact distal pulses.   No murmur heard. Pulmonary/Chest: Breath sounds normal. No respiratory distress. She has no wheezes. She has no rales.  Abdominal: Soft. There is no tenderness.  No Hepatosplenomegaly   Lymphadenopathy:    She has no cervical adenopathy.  Neurological: She is alert and oriented to person, place, and time.  Skin: Rash noted. She is not diaphoretic.  Papular rash in face more confluent around mouth and nares. There is mild crusting around nares. Also rash involving hands. No other body areas of involvement. No obvious palm or soles involvement.     ED Course  Procedures (including critical care time) Labs Review Labs Reviewed  CULTURE, GROUP A STREP  POCT RAPID STREP A ([facility] CARE ONLY)   Imaging Review No results found.  MDM   1. Viral infection    Rapid strep test is negative. Impress viral infection. Resembles coxsackie or parvovirus infection despite patient is an adult. Prescribed ibuprofen and Atarax. Supportive care and red flags that should prompt her return to medical attention discussed with patient and provided in writing.   Sharin Grave, MD 09/16/12 [phone]

## 2012-09-15 NOTE — ED Notes (Signed)
Pt c/o sore throat, body chills, rash on hands and face x 3 days. Pt stated her 37 year old was treated with a viral infection earlier this week. Pt took tylenol with no relief. Jan Ranson, SMA

## 2012-09-17 LAB — CULTURE, GROUP A STREP

## 2012-09-28 IMAGING — CT CT HEAD W/O CM
1 of 2 series · 13 of 30 positions shown, 17 images · non-contrast
Comparison: None

CLINICAL DATA: Headache.  Chills, fever, dizziness.

CT HEAD WITHOUT CONTRAST
TECHNIQUE: Contiguous axial images were obtained from the base of
the skull through the vertex without contrast.

[Series 2: brain · axial · 0.47mm/px · z∈[+81,+202]mm · 13 of 28 slices shown, 17 images]
[im 2/28  brain]
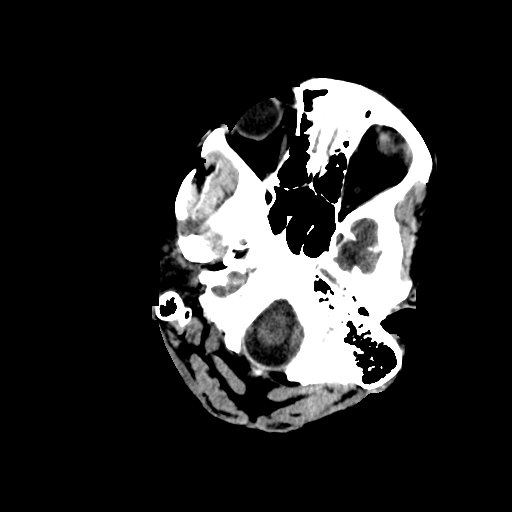
[im 2/28  bone]
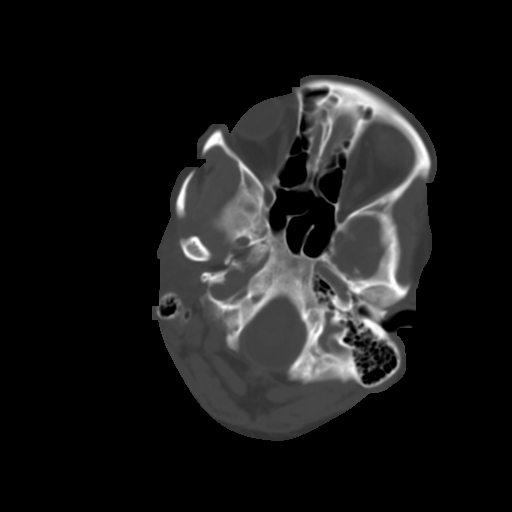
[im 4/28  brain]
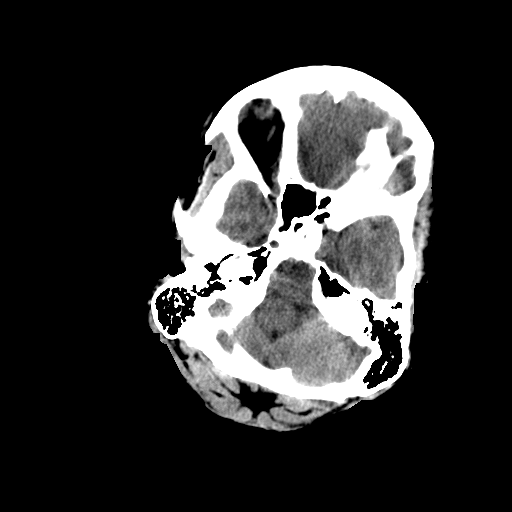
[im 6/28  brain]
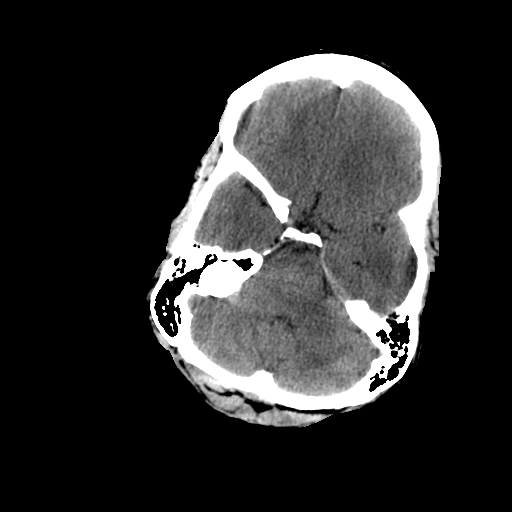
[im 8/28  brain]
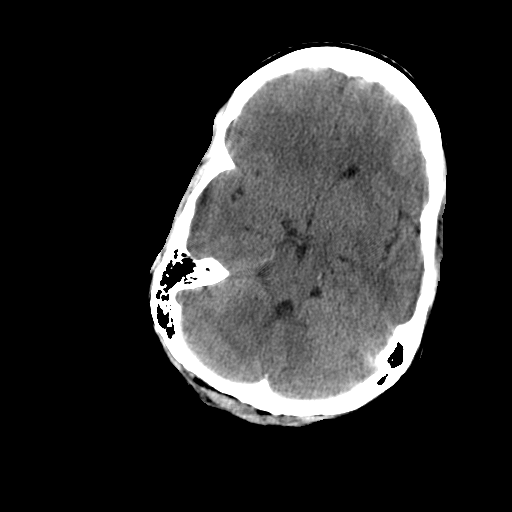
[im 10/28  brain]
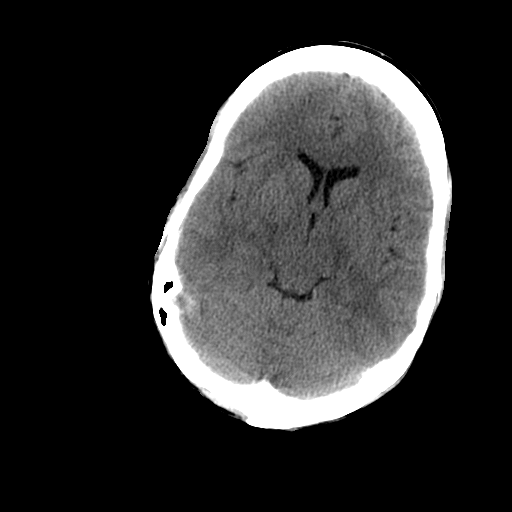
[im 10/28  bone]
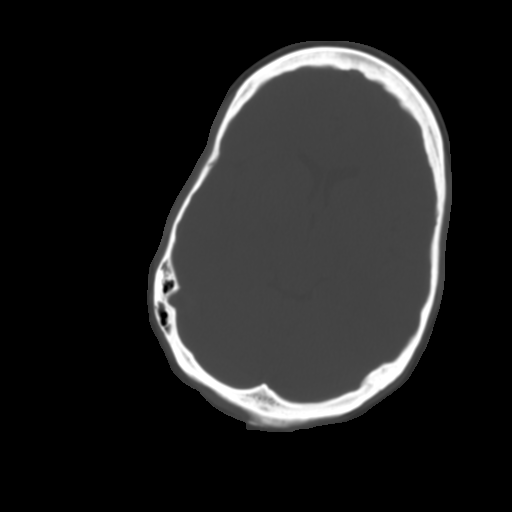
[im 12/28  brain]
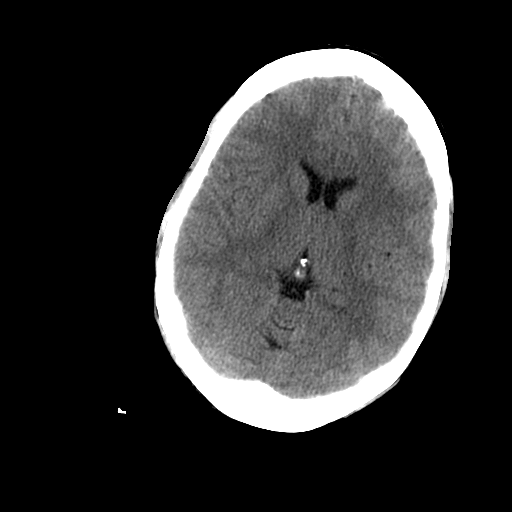
[im 14/28  brain]
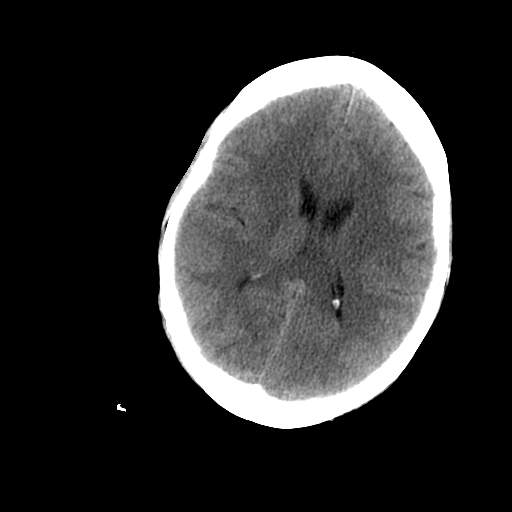
[im 16/28  brain]
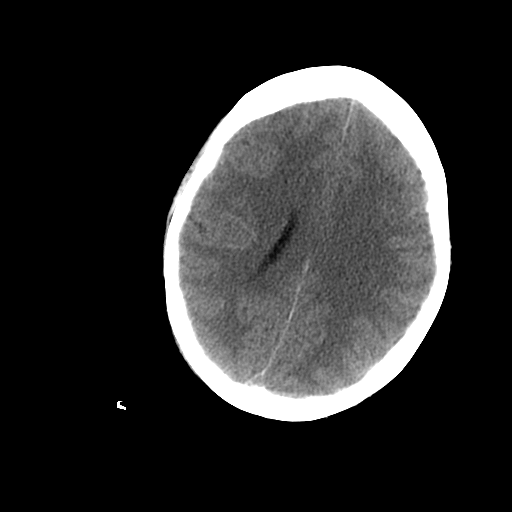
[im 18/28  brain]
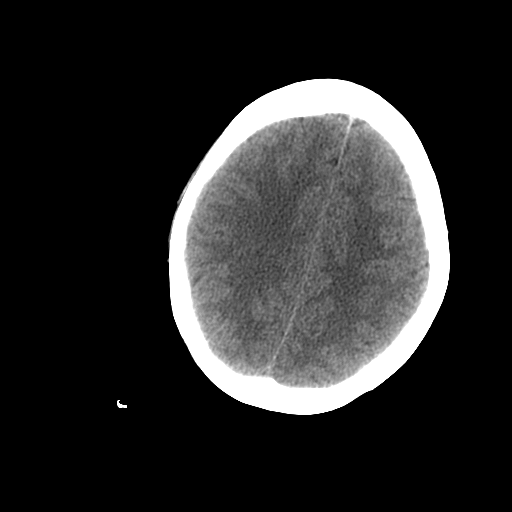
[im 18/28  bone]
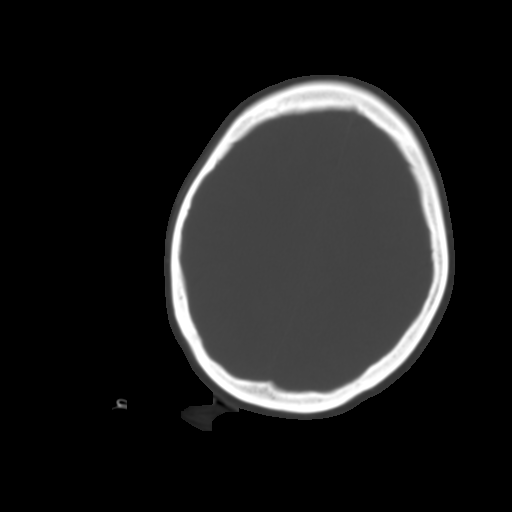
[im 20/28  brain]
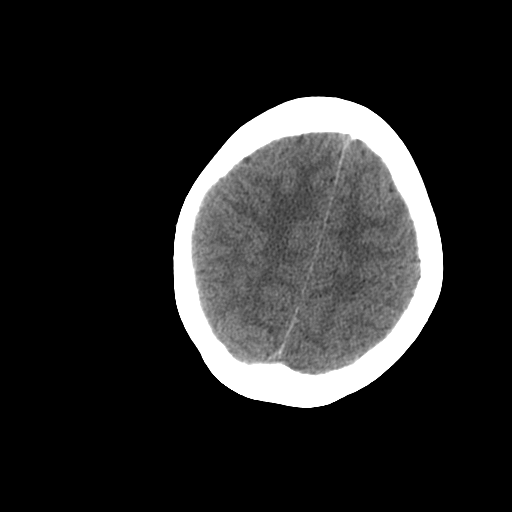
[im 22/28  brain]
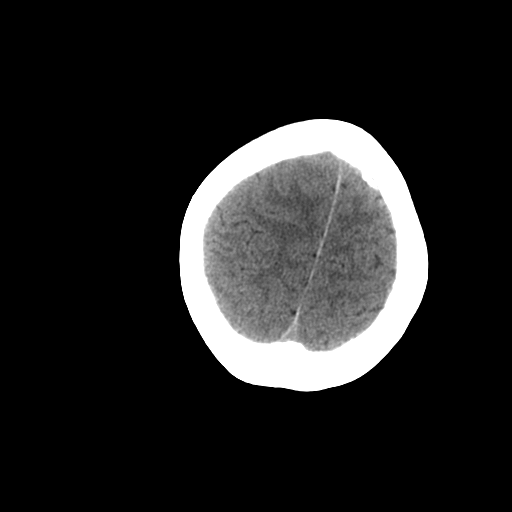
[im 24/28  brain]
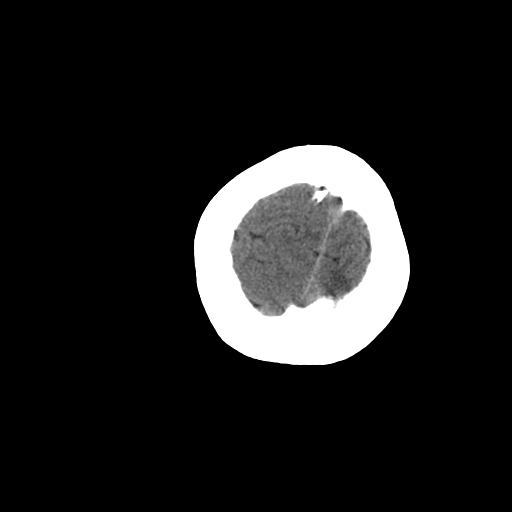
[im 26/28  brain]
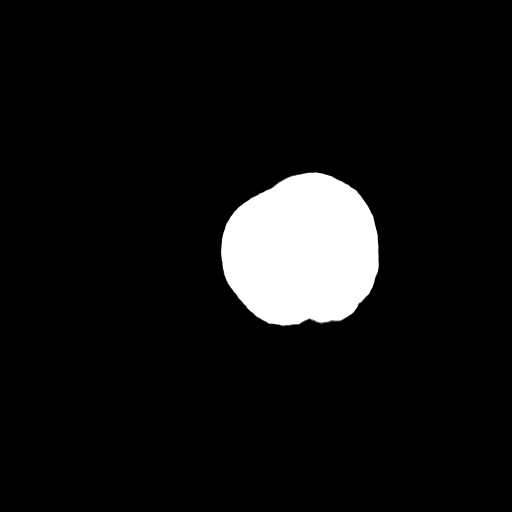
[im 26/28  bone]
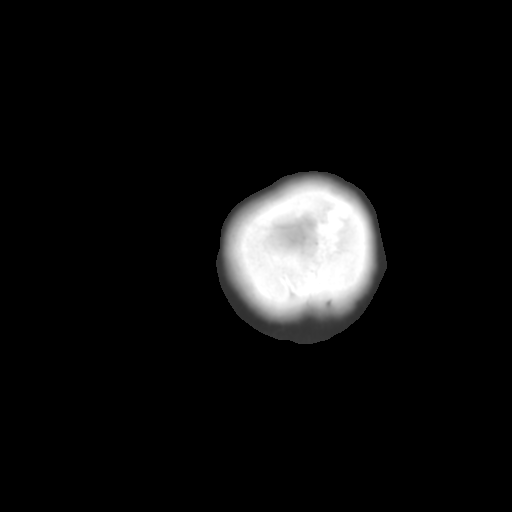

[13 of 30 positions shown; findings below may reference images not displayed]

FINDINGS: No acute intracranial abnormality.  Specifically, no
hemorrhage, hydrocephalus, mass lesion, acute infarction, or
significant intracranial injury.  No acute calvarial abnormality.
Visualized paranasal sinuses and mastoids clear.  Orbital soft
tissues unremarkable.
IMPRESSION: Normal study.

## 2013-11-05 ENCOUNTER — Encounter (HOSPITAL_COMMUNITY): Payer: Self-pay | Admitting: Emergency Medicine

## 2013-12-01 ENCOUNTER — Emergency Department (HOSPITAL_COMMUNITY): Payer: Managed Care, Other (non HMO)

## 2013-12-01 ENCOUNTER — Encounter (HOSPITAL_COMMUNITY): Payer: Self-pay | Admitting: *Deleted

## 2013-12-01 ENCOUNTER — Emergency Department (HOSPITAL_COMMUNITY)
Admission: EM | Admit: 2013-12-01 | Discharge: 2013-12-01 | Disposition: A | Discharge status: Home / Self Care | Payer: Managed Care, Other (non HMO) | Attending: Emergency Medicine | Admitting: Emergency Medicine

## 2013-12-01 DIAGNOSIS — Z862 Personal history of diseases of the blood and blood-forming organs and certain disorders involving the immune mechanism: Secondary | ICD-10-CM | POA: Insufficient documentation

## 2013-12-01 DIAGNOSIS — W228XXA Striking against or struck by other objects, initial encounter: Secondary | ICD-10-CM | POA: Insufficient documentation

## 2013-12-01 DIAGNOSIS — I509 Heart failure, unspecified: Secondary | ICD-10-CM | POA: Diagnosis not present

## 2013-12-01 DIAGNOSIS — Y998 Other external cause status: Secondary | ICD-10-CM | POA: Insufficient documentation

## 2013-12-01 DIAGNOSIS — S92532A Displaced fracture of distal phalanx of left lesser toe(s), initial encounter for closed fracture: Secondary | ICD-10-CM | POA: Diagnosis not present

## 2013-12-01 DIAGNOSIS — Z8719 Personal history of other diseases of the digestive system: Secondary | ICD-10-CM | POA: Insufficient documentation

## 2013-12-01 DIAGNOSIS — Y9289 Other specified places as the place of occurrence of the external cause: Secondary | ICD-10-CM | POA: Insufficient documentation

## 2013-12-01 DIAGNOSIS — E0590 Thyrotoxicosis, unspecified without thyrotoxic crisis or storm: Secondary | ICD-10-CM | POA: Diagnosis not present

## 2013-12-01 DIAGNOSIS — E039 Hypothyroidism, unspecified: Secondary | ICD-10-CM | POA: Insufficient documentation

## 2013-12-01 DIAGNOSIS — S92912A Unspecified fracture of left toe(s), initial encounter for closed fracture: Secondary | ICD-10-CM

## 2013-12-01 DIAGNOSIS — T1490XA Injury, unspecified, initial encounter: Secondary | ICD-10-CM

## 2013-12-01 DIAGNOSIS — Z3202 Encounter for pregnancy test, result negative: Secondary | ICD-10-CM | POA: Insufficient documentation

## 2013-12-01 DIAGNOSIS — Y9389 Activity, other specified: Secondary | ICD-10-CM | POA: Insufficient documentation

## 2013-12-01 DIAGNOSIS — S99922A Unspecified injury of left foot, initial encounter: Secondary | ICD-10-CM | POA: Diagnosis present

## 2013-12-01 DIAGNOSIS — Z79899 Other long term (current) drug therapy: Secondary | ICD-10-CM | POA: Diagnosis not present

## 2013-12-01 LAB — POC URINE PREG, ED: Preg Test, Ur: NEGATIVE

## 2013-12-01 MED ORDER — LIDOCAINE HCL (PF) 1 % IJ SOLN
5.0000 mL | Freq: Once | INTRAMUSCULAR | Status: AC
  Administered 2013-12-01: 5 mL
  Filled 2013-12-01: qty 5

## 2013-12-01 NOTE — ED Notes (Signed)
Pt wheeled out of ED via wheelchair by this RN, NAD noted.

## 2013-12-01 NOTE — ED Provider Notes (Signed)
CSN: [id number]     Arrival date & time 12/01/13  0105 History  This chart was scribed for Erica CookeyMegan Rimas Gilham, MD by [name], [name] This patient was seen in room B19C/B19C and the patient's care was started at 1:23 AM.   Chief Complaint  Patient presents with  . Toe Injury   Patient is a 38 y.o. female presenting with foot injury. The history is provided by the patient. No language interpreter was used.  Foot Injury Location:  Toe Toe location:  R little toe Pain details:    Quality:  Aching   Radiates to:  Does not radiate   Severity:  Moderate   Onset quality:  Sudden   Timing:  Constant   Progression:  Unchanged Chronicity:  New Foreign body present:  No foreign bodies Prior injury to area:  No Relieved by:  Nothing Worsened by:  Nothing tried Associated symptoms: no back pain, no fatigue, no fever and no neck pain     HPI Comments: [name] is a 38 y.o. female who presents to the Emergency Department complaining of right little toe pain that began earlier today due to patient tripping over the vacuum prior to arrival. She states that her right little toe is "sticking out in opposite direction of foot". She denies any other injury.  Past Medical History  Diagnosis Date  . Sickle cell trait   . History of congestive heart failure     with pulm edema, hyperthyroid dx  . Hypothyroidism   . Hyperthyroidism     treated with radioactive iodine now hypothyroid  . IBS (irritable bowel syndrome)   . Abnormal Pap smear    Past Surgical History  Procedure Laterality Date  . Cesarean section    . Dilation and curettage of uterus    . Gynecologic cryosurgery     Family History  Problem Relation Age of Onset  . Arthritis Mother   . Hypertension Mother   . Sickle cell trait Mother   . Diabetes Father   . Hypertension Father   . Heart attack Father   . Sickle cell trait Brother   . Cancer Maternal Aunt     breast  . Cancer Maternal Grandmother     breast   . Cancer Paternal Grandmother    History  Substance Use Topics  . Smoking status: Never Smoker   . Smokeless tobacco: Never Used  . Alcohol Use: No   OB History    Gravida Para Term Preterm AB TAB SAB Ectopic Multiple Living   4 3 2 1 1  1   2      Review of Systems  Constitutional: Negative for fever, chills, diaphoresis, activity change, appetite change and fatigue.  HENT: Negative for congestion, facial swelling, rhinorrhea and sore throat.   Eyes: Negative for photophobia and discharge.  Respiratory: Negative for cough, chest tightness and shortness of breath.   Cardiovascular: Negative for chest pain, palpitations and leg swelling.  Gastrointestinal: Negative for nausea, vomiting, abdominal pain and diarrhea.  Endocrine: Negative for polydipsia and polyuria.  Genitourinary: Negative for dysuria, frequency, difficulty urinating and pelvic pain.  Musculoskeletal: Positive for arthralgias. Negative for back pain, neck pain and neck stiffness.  Skin: Negative for color change and wound.  Allergic/Immunologic: Negative for immunocompromised state.  Neurological: Negative for facial asymmetry, weakness, numbness and headaches.  Hematological: Does not bruise/bleed easily.  Psychiatric/Behavioral: Negative for confusion and agitation.   Allergies  Review of patient's allergies indicates no known allergies.  Home Medications   Prior to Admission medications   Medication Sig Start Date End Date Taking? Authorizing Provider  levothyroxine (SYNTHROID, LEVOTHROID) 75 MCG tablet Take 75 mcg by mouth daily before breakfast.   Yes Historical Provider, MD  Probiotic Product (ALIGN) 4 MG CAPS Take 4 mg by mouth daily.   Yes Historical Provider, MD  benzocaine (HURRICAINE) 20 % GEL Use as directed 1 application in the mouth or throat 3 (three) times daily as needed. Patient not taking: Reported on 12/01/2013 09/15/12   Erica Fudge Moreno-Coll, MD  hydrOXYzine (ATARAX/VISTARIL) 25 MG tablet Take  1 tablet (25 mg total) by mouth every 8 (eight) hours as needed for itching. Patient not taking: Reported on 12/01/2013 09/15/12   Erica Fudge Moreno-Coll, MD  ibuprofen (ADVIL,MOTRIN) 600 MG tablet Take 1 tablet (600 mg total) by mouth every 8 (eight) hours as needed for pain or fever (take with meals). Patient not taking: Reported on 12/01/2013 09/15/12   Erica Moreno-Coll, MD  levothyroxine (SYNTHROID, LEVOTHROID) 100 MCG tablet Take 100 mcg by mouth daily.    Historical Provider, MD  mupirocin ointment (BACTROBAN) 2 % Apply topically 3 (three) times daily. Patient not taking: Reported on 12/01/2013 09/15/12   Erica Grave, MD  Prenatal Vit-Fe Fumarate-FA (PRENATAL MULTIVITAMIN) TABS Take 1 tablet by mouth daily.    Historical Provider, MD   Triage Vitals: BP 141/68 mmHg  Pulse 75  Temp(Src) 98.2 F (36.8 C) (Oral)  Resp 18  Ht 5\' 1"  (1.549 m)  Wt 130 lb (58.968 kg)  BMI 24.58 kg/m2  SpO2 100%  Physical Exam  Constitutional: She is oriented to person, place, and time. She appears well-developed and well-nourished. No distress.  HENT:  Head: Normocephalic.  Mouth/Throat: Oropharynx is clear and moist.  Eyes: Pupils are equal, round, and reactive to light.  Neck: Neck supple.  Cardiovascular: Normal rate, regular rhythm and normal heart sounds.   Pulmonary/Chest: Effort normal and breath sounds normal. No respiratory distress. She has no wheezes.  Abdominal: Soft. She exhibits no distension. There is no tenderness. There is no rebound and no guarding.  Musculoskeletal: She exhibits tenderness. She exhibits no edema.  Lateral Deformity of her right little toe. Tenderness to palpation at distal 4th and 5th metatarsals.  Neurological: She is alert and oriented to person, place, and time.  Skin: Skin is warm and dry.  Psychiatric: She has a normal mood and affect.  Nursing note and vitals reviewed.  ED Course  Procedures (including critical care time)  DIAGNOSTIC STUDIES: Oxygen  Saturation is 100% on RA, normal by my interpretation.    COORDINATION OF CARE: 1:30 AM- Discussed plans to obtain diagnostic imaging of right foot and lab work. Pt advised of plan for treatment and pt agrees.  Labs Review Labs Reviewed  POC URINE PREG, ED   Imaging Review Dg Foot Complete Right  12/01/2013   CLINICAL DATA:  Right fifth toe pain and deformity after tripping over vacuum cleaner, acute onset. Initial encounter.  EXAM: RIGHT FOOT COMPLETE - 3+ VIEW  COMPARISON:  None.  FINDINGS: There is a minimally displaced and angulated fracture involving the distal aspect of the fifth proximal phalanx, without evidence of intra-articular extension. No additional fractures are seen.  Incidental note is made of a bipartite medial sesamoid of the first toe. The joint spaces are preserved. There is no evidence of talar subluxation; the subtalar joint is unremarkable in appearance. An os subfibulare is noted.  No significant soft tissue abnormalities are seen.  IMPRESSION: 1.  Minimally displaced and angulated fracture involving the distal aspect of the fifth proximal phalanx, without evidence of intra-articular extension. 2. Bipartite medial sesamoid of the first toe incidentally noted. 3. Os subfibulare noted.   Electronically Signed   By: Roanna RaiderJeffery  Chang M.D.   On: 12/01/2013 02:23     EKG Interpretation None     MDM   Final diagnoses:  Closed fracture of toe, phalanx, left, initial encounter    Pt is a 38 y.o. female with Pmhx as above who presents with pain and deformity of left little toe after she hit it on a vacuum cleaner.  X-ray shows minimally displaced angular fracture involving the distal aspect of the fifth proximal phalanx without evidence of intra-articular extension.  Digital block done and fracture reduction attempted with minimal improvement of alignment.  Toe buddy taped.  Patient placed in postop shoe and will be given crutches.  Plan for no weightbearing and outpatient  follow-up with orthopedics.      I personally performed the services described in this documentation, which was scribed in my presence. The recorded information has been reviewed and is accurate.  Erica CookeyMegan Sansa Alkema, MD 12/01/13 [id number]

## 2013-12-01 NOTE — Discharge Instructions (Signed)
Buddy Taping of Toes °We have taped your toes together to keep them from moving. This is called "buddy taping" since we used a part of your own body to keep the injured part still. We placed soft padding between your toes to keep them from rubbing against each other. Buddy taping will help with healing and to reduce pain. Keep your toes buddy taped together for as long as directed by your caregiver. °HOME CARE INSTRUCTIONS  °· Raise your injured area above the level of your heart while sitting or lying down. Prop it up with pillows. °· An ice pack used every twenty minutes, while awake, for the first one to two days may be helpful. Put ice in a plastic bag and put a towel between the bag and your skin. °· Watch for signs that the taping is too tight. These signs may be: °¨ Numbness of your taped toes. °¨ Coolness of your taped toes. °¨ Color change in the area beyond the tape. °¨ Increased pain. °· If you have any of these signs, loosen or rewrap the tape. If you need to loosen or rewrap the buddy tape, make sure you use the padding again. °SEEK IMMEDIATE MEDICAL CARE IF:  °· You have worse pain, swelling, inflammation (soreness), drainage or bleeding after you rewrap the tape. °· Any new problems occur. °MAKE SURE YOU:  °· Understand these instructions. °· Will watch your condition. °· Will get help right away if you are not doing well or get worse. °Document Released: 09/25/2003 Document Revised: 03/15/2011 Document Reviewed: 12/19/2007 °[employer]® Patient Information ©2015 [employer]. This information is not intended to replace advice given to you by your health care provider. Make sure you discuss any questions you have with your health care provider. ° °

## 2013-12-01 NOTE — ED Notes (Signed)
The pt is c/o rt little toe pain after she tripped over the vacuum just pta  Tonight.  lmp nov 5th

## 2015-04-07 ENCOUNTER — Ambulatory Visit (INDEPENDENT_AMBULATORY_CARE_PROVIDER_SITE_OTHER): Payer: BLUE CROSS/BLUE SHIELD | Admitting: Family Medicine

## 2015-04-07 VITALS — BP 122/64 | HR 103 | Temp 99.1°F | Resp 16 | Ht 61.0 in | Wt 130.6 lb

## 2015-04-07 DIAGNOSIS — R05 Cough: Secondary | ICD-10-CM | POA: Diagnosis not present

## 2015-04-07 DIAGNOSIS — H109 Unspecified conjunctivitis: Secondary | ICD-10-CM

## 2015-04-07 DIAGNOSIS — R6889 Other general symptoms and signs: Secondary | ICD-10-CM

## 2015-04-07 DIAGNOSIS — R059 Cough, unspecified: Secondary | ICD-10-CM

## 2015-04-07 LAB — POCT INFLUENZA A/B
INFLUENZA B, POC: NEGATIVE
Influenza A, POC: NEGATIVE

## 2015-04-07 MED ORDER — BENZONATATE 100 MG PO CAPS: 100.0000 mg | ORAL_CAPSULE | Freq: Three times a day (TID) | ORAL | Status: DC | PRN

## 2015-04-07 MED ORDER — HYDROCODONE-HOMATROPINE 5-1.5 MG/5ML PO SYRP: 5.0000 mL | ORAL_SOLUTION | ORAL | Status: DC | PRN

## 2015-04-07 MED ORDER — OFLOXACIN 0.3 % OP SOLN: 1.0000 [drp] | Freq: Four times a day (QID) | OPHTHALMIC | Status: DC

## 2015-04-07 NOTE — Patient Instructions (Addendum)
Drink plenty of fluids and get enough rest  Take Tylenol or ibuprofen or Aleve if needed for fever or aching  Take the benzonatate cough pills one or 2 pills 3 times daily as needed for daytime cough  Take the Hycodan cough syrup 1 teaspoon every 4-6 hours as needed for nighttime cough. This will be a little sedating.  If the eye is still infected looking you probably should stay off work tomorrow.  Return as needed    IF you received an x-ray today, you will receive an invoice from [facility]. Please contact [facility] at [phone] with questions or concerns regarding your invoice.   IF you received labwork today, you will receive an invoice from [facility]. Please contact [facility] at [phone] with questions or concerns regarding your invoice.   Our billing staff will not be able to assist you with questions regarding bills from these companies.  You will be contacted with the lab results as soon as they are available. The fastest way to get your results is to activate your My Chart account. Instructions are located on the last page of this paperwork. If you have not heard from us regarding the results in 2 weeks, please contact this office.

## 2015-04-07 NOTE — Progress Notes (Signed)
Patient ID: DENNISHA MOUSER, female    DOB: [birth year]  Age: 40 y.o. MRN: [id number]  Chief Complaint  Patient presents with  . Ear Pain  . Fatigue  . Eye Pain  . Nasal Congestion  . 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 She has a 26-year-old child at home who has had a respiratory tract infection from daycare. She started getting ill a week ago, initially just felt a little bit bad, developed a sore throat, then head congestion and cough. The last couple days has had redness of the right eye. She's not been running a fever. She did have a flu shot this year. She is generally pretty healthy.  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 That is all taken care of now.  Current allergies, medications, problem list, past/family and social histories reviewed.  Objective:  BP 122/64 mmHg  Pulse 103  Temp(Src) 99.1 F (37.3 C) (Oral)  Resp 16  Ht  (1.549 m)  Wt 130 lb 9.6 oz (59.24 kg)  BMI 24.69 kg/m2  SpO2 98%  LMP 03/27/2015  Temperature is noted to be borderline at 99.1. Her TMs are normal. Right conjunctiva is injected. She had crusting this morning but none is visible now. Her left eye looks fine. Throat clear without erythema. Neck supple without major nodes. Chest is clear to auscultation. Heart regular without murmurs.  Assessment & Plan:   Assessment: 1. Flu-like symptoms   2. Conjunctivitis of right eye   3. Cough       Plan: Check flu swab  Orders Placed This Encounter  Procedures  . POCT Influenza A/B    Meds ordered this encounter  Medications  . desogestrel-ethinyl estradiol (AZURETTE) 0.15-0.02/0.01 MG (21/5) tablet    Sig: Take 1 tablet by mouth daily.  Marland Kitchen ofloxacin (OCUFLOX) 0.3 % ophthalmic solution    Sig: Place 1 drop into the right eye 4 (four) times daily.    Dispense:  5 mL    Refill:  0  . HYDROcodone-homatropine  (HYCODAN) 5-1.5 MG/5ML syrup    Sig: Take 5 mLs by mouth every 4 (four) hours as needed.    Dispense:  120 mL    Refill:  0  . benzonatate (TESSALON) 100 MG capsule    Sig: Take 1-2 capsules (100-200 mg total) by mouth 3 (three) times daily as needed.    Dispense:  30 capsule    Refill:  0         Patient Instructions   Drink plenty of fluids and get enough rest  Take Tylenol or ibuprofen or Aleve if needed for fever or aching  Take the benzonatate cough pills one or 2 pills 3 times daily as needed for daytime cough  Take the Hycodan cough syrup 1 teaspoon every 4-6 hours as needed for nighttime cough. This will be a little sedating.  If the eye is still infected looking you probably should stay off work tomorrow.  Return as needed    IF you received an x-ray today, you will receive an invoice from [facility]. Please contact [facility] at [phone] with questions or concerns regarding your invoice.   IF you received labwork today, you will receive an invoice from [facility]. Please contact [facility] at [phone] with questions or concerns regarding your invoice.   Our billing staff will not be  able to assist you with questions regarding bills from these companies.  You will be contacted with the lab results as soon as they are available. The fastest way to get your results is to activate your My Chart account. Instructions are located on the last page of this paperwork. If you have not heard from us regarding the results in 2 weeks, please contact this office.         Return if symptoms worsen or fail to improve.   Blinda Turek, MD 04/07/2015

## 2015-04-08 ENCOUNTER — Telehealth: Payer: Self-pay

## 2015-04-08 MED ORDER — GUAIFENESIN-CODEINE 100-10 MG/5ML PO SOLN: 5.0000 mL | Freq: Three times a day (TID) | ORAL | Status: DC | PRN

## 2015-04-08 NOTE — Telephone Encounter (Signed)
[name] states the cough medicine given to his wife, the manufacturer do not have it nor the pharmacy, would like to have something else called in. Please call [phone]    [facility] [address]

## 2015-04-08 NOTE — Telephone Encounter (Signed)
Tussionex may be available.

## 2015-04-08 NOTE — Telephone Encounter (Signed)
The hycodan or the tessalon perles - this seems strange to me during flu season and I have not had other people complain

## 2015-04-08 NOTE — Telephone Encounter (Signed)
Done

## 2015-04-08 NOTE — Telephone Encounter (Signed)
Spoke with pt, she would like something that we can call in please tonight.

## 2015-04-10 NOTE — Telephone Encounter (Signed)
Faxed and notified pt

## 2015-10-01 ENCOUNTER — Other Ambulatory Visit: Payer: Self-pay | Admitting: Obstetrics and Gynecology

## 2015-10-01 DIAGNOSIS — R928 Other abnormal and inconclusive findings on diagnostic imaging of breast: Secondary | ICD-10-CM

## 2015-10-06 ENCOUNTER — Ambulatory Visit
Admission: RE | Admit: 2015-10-06 | Discharge: 2015-10-06 | Disposition: A | Discharge status: Home / Self Care | Payer: BLUE CROSS/BLUE SHIELD | Source: Ambulatory Visit | Attending: Obstetrics and Gynecology | Admitting: Obstetrics and Gynecology

## 2015-10-06 DIAGNOSIS — R928 Other abnormal and inconclusive findings on diagnostic imaging of breast: Secondary | ICD-10-CM

## 2016-06-19 IMAGING — DX DG FOOT COMPLETE 3+V*R*
3 series · 3 of 3 positions shown · non-contrast
Comparison: None.

CLINICAL DATA: Right fifth toe pain and deformity after tripping
over vacuum cleaner, acute onset. Initial encounter.

EXAM:
RIGHT FOOT COMPLETE - 3+ VIEW

[foot ap]
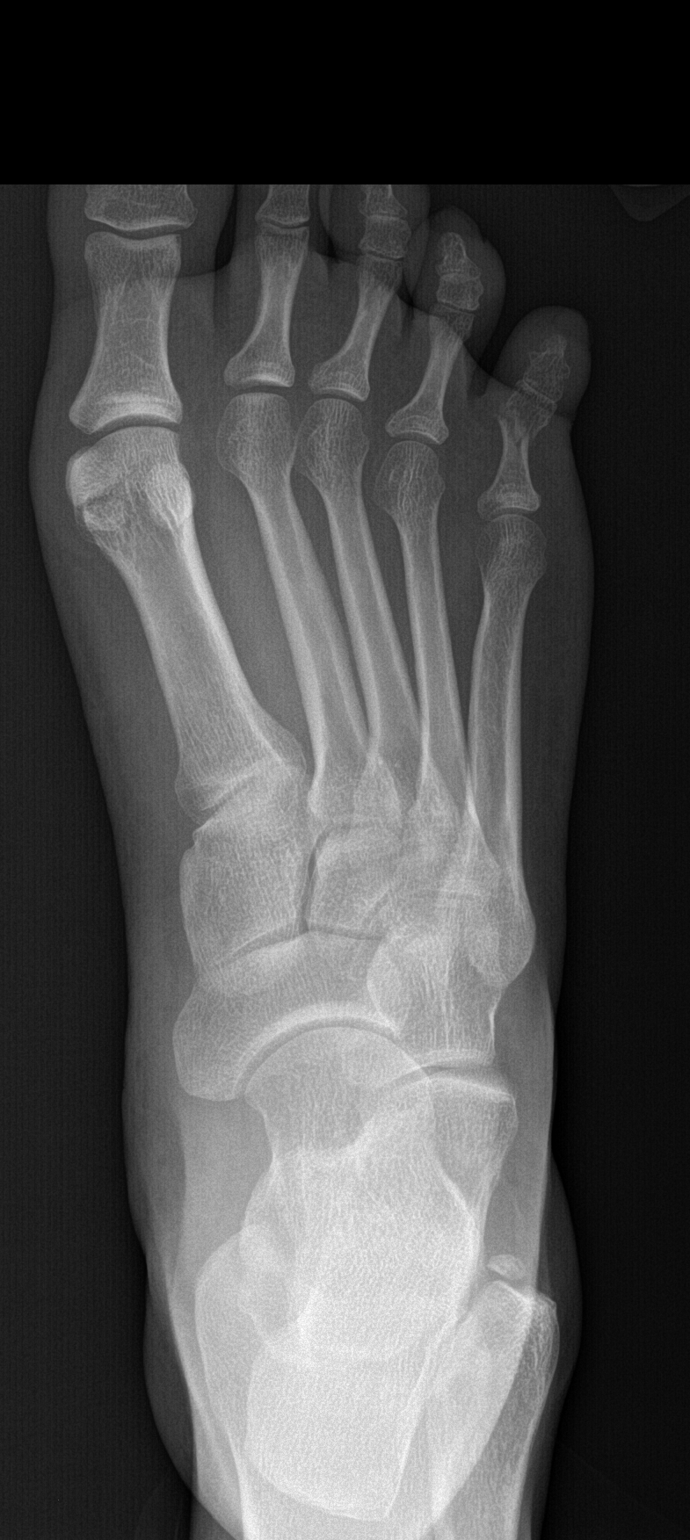

[foot obl]
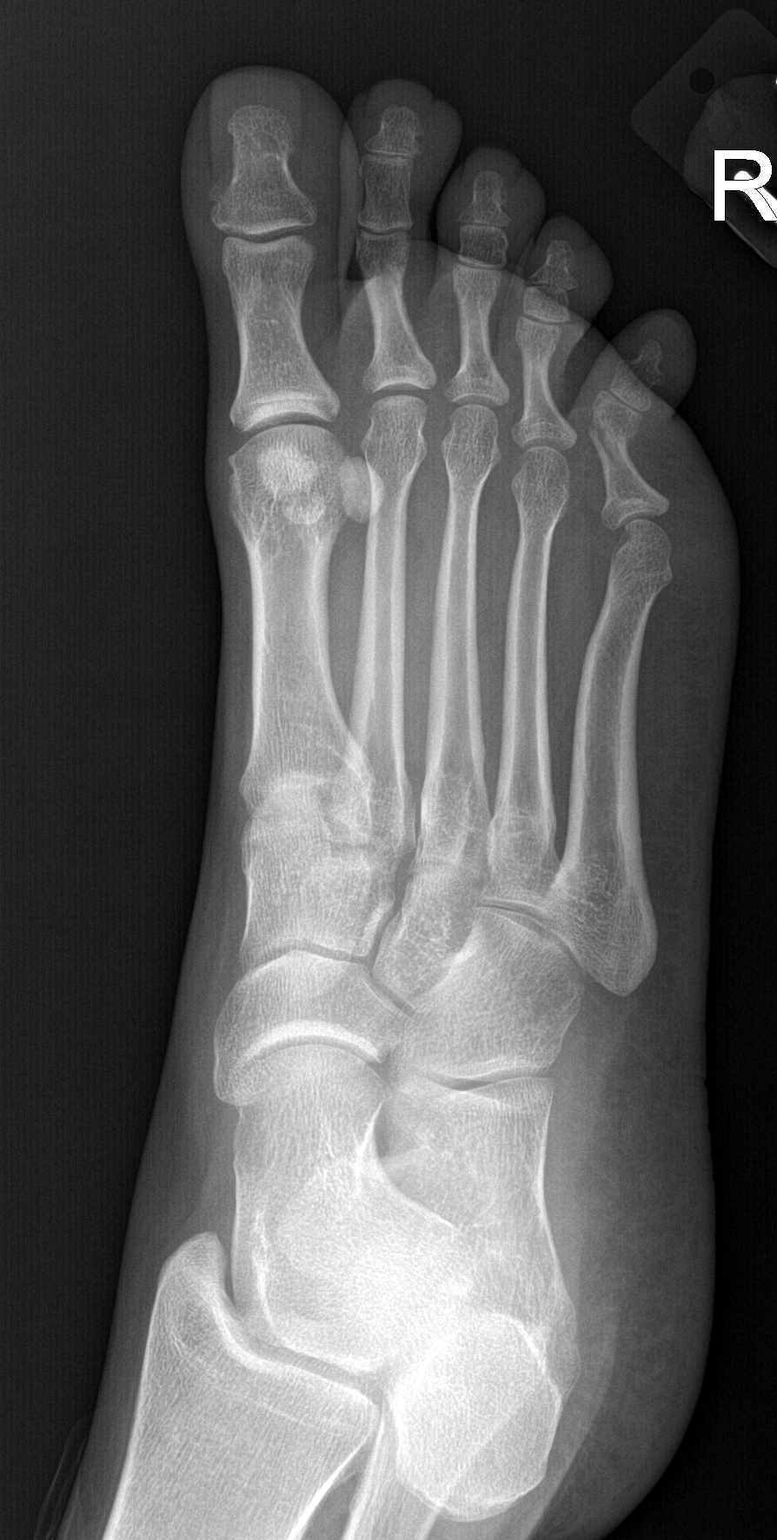

[foot lat]
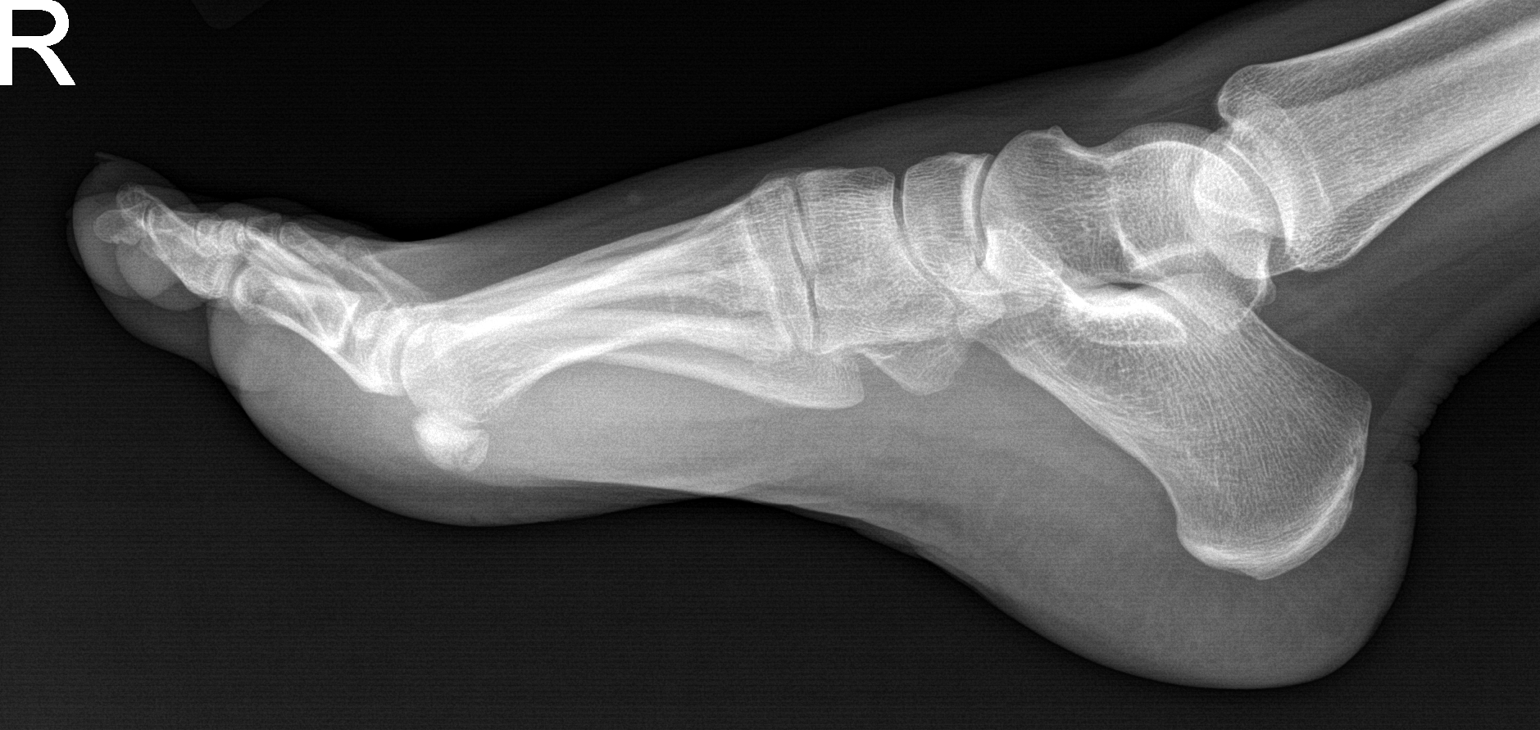

[3 of 3 positions shown; findings below may reference images not displayed]

FINDINGS: There is a minimally displaced and angulated fracture involving the
distal aspect of the fifth proximal phalanx, without evidence of
intra-articular extension. No additional fractures are seen.

Incidental note is made of a bipartite medial sesamoid of the first
toe. The joint spaces are preserved. There is no evidence of talar
subluxation; the subtalar joint is unremarkable in appearance. An os
subfibulare is noted.

No significant soft tissue abnormalities are seen.
IMPRESSION: 1. Minimally displaced and angulated fracture involving the distal
aspect of the fifth proximal phalanx, without evidence of
intra-articular extension.
2. Bipartite medial sesamoid of the first toe incidentally noted.
3. Os subfibulare noted.

## 2016-12-27 ENCOUNTER — Ambulatory Visit
Admission: RE | Admit: 2016-12-27 | Discharge: 2016-12-27 | Disposition: A | Discharge status: Home / Self Care | Source: Ambulatory Visit | Attending: Nurse Practitioner | Admitting: Nurse Practitioner

## 2016-12-27 ENCOUNTER — Other Ambulatory Visit: Payer: Self-pay | Admitting: Nurse Practitioner

## 2016-12-27 DIAGNOSIS — W19XXXA Unspecified fall, initial encounter: Secondary | ICD-10-CM

## 2017-10-07 ENCOUNTER — Ambulatory Visit (INDEPENDENT_AMBULATORY_CARE_PROVIDER_SITE_OTHER): Admitting: Nurse Practitioner

## 2017-10-07 ENCOUNTER — Other Ambulatory Visit: Admitting: Nurse Practitioner

## 2017-10-07 VITALS — BP 120/80 | HR 85 | Temp 98.2°F | Ht 60.0 in | Wt 132.8 lb

## 2017-10-07 DIAGNOSIS — Z0000 Encounter for general adult medical examination without abnormal findings: Secondary | ICD-10-CM | POA: Diagnosis not present

## 2017-10-07 DIAGNOSIS — Z113 Encounter for screening for infections with a predominantly sexual mode of transmission: Secondary | ICD-10-CM

## 2017-10-07 DIAGNOSIS — F419 Anxiety disorder, unspecified: Secondary | ICD-10-CM | POA: Diagnosis not present

## 2017-10-07 DIAGNOSIS — Z23 Encounter for immunization: Secondary | ICD-10-CM

## 2017-10-07 DIAGNOSIS — E039 Hypothyroidism, unspecified: Secondary | ICD-10-CM

## 2017-10-07 DIAGNOSIS — E782 Mixed hyperlipidemia: Secondary | ICD-10-CM

## 2017-10-07 LAB — POCT URINALYSIS DIPSTICK
BILIRUBIN UA: NEGATIVE
Blood, UA: NEGATIVE
Glucose, UA: NEGATIVE
Ketones, UA: NEGATIVE
Leukocytes, UA: NEGATIVE
Nitrite, UA: NEGATIVE
PROTEIN UA: NEGATIVE
Spec Grav, UA: 1.015 (ref 1.010–1.025)
Urobilinogen, UA: NEGATIVE E.U./dL — AB
pH, UA: 7 (ref 5.0–8.0)

## 2017-10-07 MED ORDER — BUSPIRONE HCL 5 MG PO TABS: 5.0000 mg | ORAL_TABLET | Freq: Every day | ORAL | Status: DC

## 2017-10-07 NOTE — Progress Notes (Signed)
Came in to get her Health Maintenance labs this morning.

## 2017-10-07 NOTE — Progress Notes (Addendum)
Subjective:     Patient ID: [name] , female    DOB: 1975-12-11 , 42 y.o.   MRN: [id number]   Anxiety  Presents for initial visit. Onset was more than 5 years ago. The problem has been gradually worsening. Symptoms include dry mouth, insomnia, irritability, nervous/anxious behavior and palpitations. Patient reports no confusion, dizziness or suicidal ideas. Symptoms occur constantly. The severity of symptoms is moderate. Exacerbated by: Illnesses and death, fear something bad will happen. The quality of sleep is fair.   Risk factors include a major life event. Past treatments include nothing.    Preventive The patient states she uses OCP (estrogen/progesterone) for birth control. Last LMP was 09/16/17 No LMP recorded.. Negative for Dysmenorrhea and Negative for Menorrhagia Mammogram scheduled for Nov 1. Negative for: breast discharge, breast lump(s), breast pain and regularly does breast self exam. Associated symptoms include abnormal vaginal bleeding. Pertinent negatives include abnormal bleeding (hematology), anxiety, decreased libido, depression, difficulty falling sleep, dyspareunia, history of infertility, nocturia, sexual dysfunction, sleep disturbances, urinary incontinence, urinary urgency, vaginal discharge and vaginal itching. Diet regular. The patient states she is not exercising.      . The patient's tobacco use is:   Social History   Tobacco Use  Smoking Status Never Smoker  Smokeless Tobacco Never Used  . She has been exposed to passive smoke. The patient's alcohol use is:  Social History   Substance and Sexual Activity  Alcohol Use No  . Additional information:    Past Medical History:  Diagnosis Date  . Abnormal Pap smear   . Anemia   . Anxiety   . CHF (congestive heart failure) (HCC)   . History of congestive heart failure    with pulm edema, hyperthyroid dx  . Hyperthyroidism    treated with radioactive iodine now hypothyroid  . Hypothyroidism   . IBS  (irritable bowel syndrome)   . Sickle cell trait (HCC)       Current Outpatient Medications:  .  desogestrel-ethinyl estradiol (AZURETTE) 0.15-0.02/0.01 MG (21/5) tablet, Take 1 tablet by mouth daily., Disp: , Rfl:  .  Levothyroxine Sodium 88 MCG CAPS, Take 88 mcg by mouth daily before breakfast. , Disp: , Rfl:    Review of Systems  Constitutional: Positive for irritability.  Eyes: Negative.   Respiratory: Negative.   Cardiovascular: Positive for palpitations.  Gastrointestinal: Negative.   Endocrine: Negative.   Genitourinary: Negative.   Musculoskeletal: Negative.   Skin: Negative.   Allergic/Immunologic: Negative.   Neurological: Negative.  Negative for dizziness.  Hematological: Negative.   Psychiatric/Behavioral: Negative for confusion and suicidal ideas. The patient is nervous/anxious and has insomnia.        Anxiety, she lost her daughter in 2011. Revolves around illness and death.  Took Zoloft in the past.       Today's Vitals   10/07/17 1514  BP: 120/80  Pulse: 85  Temp: 98.2 F (36.8 C)  TempSrc: Oral  SpO2: 97%  Weight: 132 lb 12.8 oz (60.2 kg)  Height: 5' (1.524 m)  PainSc: 0-No pain   Body mass index is 25.94 kg/m.   Objective:  Physical Exam  Constitutional: She appears well-developed and well-nourished.  HENT:  Head: Normocephalic and atraumatic.  Right Ear: External ear normal.  Left Ear: External ear normal.  Nose: Nose normal.  Mouth/Throat: Oropharynx is clear and moist.  Eyes: Pupils are equal, round, and reactive to light. Conjunctivae and EOM are normal.  Neck: Normal range of motion. Neck supple.  Cardiovascular:  Normal rate, regular rhythm, normal heart sounds and intact distal pulses.  Pulmonary/Chest: Effort normal and breath sounds normal. No breast swelling, tenderness, discharge or bleeding. Breasts are symmetrical.  Abdominal: Soft. Bowel sounds are normal.  Skin: Skin is warm and dry. Capillary refill takes less than 2 seconds.   Psychiatric: Her speech is normal and behavior is normal. Judgment and thought content normal. Her mood appears anxious. Cognition and memory are normal.        Assessment And Plan:     1. Encounter for health maintenance examination in adult  Full exam done.  Behavior modifications discussed and diet history reviewed.   Pt will continue to exercise regularly and modify diet with low GI, plant based foods and decrease intake of processed foods.  Recommend intake of daily multivitamin, Vitamin D, and calcium.  Recommend mammogram and colonoscopy for preventive screenings, as well as recommend immunizations that include influenza, TDAP Sees [facility] for PAPs   - POCT Urinalysis Dipstick (81002)  2. Need for influenza vaccination  Influenza vaccine given in office  Encouraged to take Tylenol as needed for any muscle aches/low grade fever - Flu Vaccine QUAD 6+ mos PF IM (Fluarix Quad PF)  3. Screening for STD (sexually transmitted disease)  - Chlamydia/Gonococcus/Trichomonas, NAA - HIV antibody (with reflex) - T pallidum Screening Cascade  4. Anxiety  New problem, experienced the loss a child in 2011 since that time anxiety more heightend particullarly with illnesses and death.   Will refer to counselor/therapist for further evaluation  Anxiety score of 11  Return in 4-6 weeks for medication check - busPIRone (BUSPAR) tablet 5 mg - Ambulatory referral to Psychology Arnette Felts, FNP

## 2017-10-07 NOTE — Patient Instructions (Addendum)
   Referral sent for counseling/therapy  Behavior modifications discussed and diet history reviewed.  Pt will continue to exercise regularly and modify diet with low GI, plant based foods and decrease intake of processed foods. Recommend intake of daily multivitamin, Vitamin D, and calcium. Recommend mammogram (scheduled) for preventive screenings, as well as recommend immunizations that include influenza and TDAP

## 2017-10-08 LAB — BMP8+EGFR
BUN/Creatinine Ratio: 8 — ABNORMAL LOW (ref 9–23)
BUN: 7 mg/dL (ref 6–24)
CALCIUM: 9.9 mg/dL (ref 8.7–10.2)
CO2: 22 mmol/L (ref 20–29)
Chloride: 103 mmol/L (ref 96–106)
Creatinine, Ser: 0.83 mg/dL (ref 0.57–1.00)
GFR calc Af Amer: 99 mL/min/{1.73_m2} (ref >59)
GFR calc non Af Amer: 86 mL/min/{1.73_m2} (ref >59)
Glucose: 81 mg/dL (ref 65–99)
POTASSIUM: 4.7 mmol/L (ref 3.5–5.2)
Sodium: 142 mmol/L (ref 134–144)

## 2017-10-08 LAB — CBC
Hematocrit: 38.5 % (ref 34.0–46.6)
Hemoglobin: 12.3 g/dL (ref 11.1–15.9)
MCH: 29.6 pg (ref 26.6–33.0)
MCHC: 31.9 g/dL (ref 31.5–35.7)
MCV: 93 fL (ref 79–97)
PLATELETS: 381 10*3/uL (ref 150–450)
RBC: 4.16 x10E6/uL (ref 3.77–5.28)
RDW: 12.1 % — ABNORMAL LOW (ref 12.3–15.4)
WBC: 5 10*3/uL (ref 3.4–10.8)

## 2017-10-08 LAB — LIPID PANEL
Chol/HDL Ratio: 3 ratio (ref 0.0–4.4)
Cholesterol, Total: 196 mg/dL (ref 100–199)
HDL: 65 mg/dL (ref >39)
LDL Calculated: 99 mg/dL (ref 0–99)
Triglycerides: 159 mg/dL — ABNORMAL HIGH (ref 0–149)
VLDL CHOLESTEROL CAL: 32 mg/dL (ref 5–40)

## 2017-10-08 LAB — TSH: TSH: 1.04 u[IU]/mL (ref 0.450–4.500)

## 2017-10-08 LAB — T3, FREE: T3, Free: 3 pg/mL (ref 2.0–4.4)

## 2017-10-08 LAB — T4: T4 TOTAL: 13 ug/dL — AB (ref 4.5–12.0)

## 2017-10-12 LAB — SPECIMEN STATUS REPORT

## 2017-10-12 LAB — T PALLIDUM SCREENING CASCADE: T pallidum Antibodies (TP-PA): NEGATIVE

## 2017-10-12 LAB — HIV ANTIBODY (ROUTINE TESTING W REFLEX)

## 2017-10-17 ENCOUNTER — Other Ambulatory Visit: Payer: Self-pay | Admitting: Nurse Practitioner

## 2017-10-17 DIAGNOSIS — F419 Anxiety disorder, unspecified: Secondary | ICD-10-CM

## 2017-10-17 MED ORDER — BUSPIRONE HCL 5 MG PO TABS: 5.0000 mg | ORAL_TABLET | Freq: Every day | ORAL | 2 refills | Status: DC

## 2017-11-14 ENCOUNTER — Encounter: Payer: Self-pay | Admitting: Nurse Practitioner

## 2017-11-14 ENCOUNTER — Ambulatory Visit (INDEPENDENT_AMBULATORY_CARE_PROVIDER_SITE_OTHER): Admitting: Nurse Practitioner

## 2017-11-14 DIAGNOSIS — F419 Anxiety disorder, unspecified: Secondary | ICD-10-CM

## 2017-11-14 MED ORDER — HYDROXYZINE PAMOATE 25 MG PO CAPS: 25.0000 mg | ORAL_CAPSULE | Freq: Three times a day (TID) | ORAL | 0 refills | Status: DC | PRN

## 2017-11-14 MED ORDER — BUSPIRONE HCL 10 MG PO TABS: 10.0000 mg | ORAL_TABLET | Freq: Three times a day (TID) | ORAL | 2 refills | Status: DC

## 2017-11-14 NOTE — Progress Notes (Signed)
Subjective:     Patient ID: [name] , female    DOB: 07-08-1975 , 42 y.o.   MRN: [id number]   Chief Complaint  Patient presents with  . med check    HPI  Anxiety  Presents for follow-up visit. Patient reports no chest pain, dizziness or shortness of breath. Symptoms occur occasionally. The severity of symptoms is moderate. The quality of sleep is fair.   Side effects of treatment include GI discomfort (She had anxiety after eating).     Past Medical History:  Diagnosis Date  . Abnormal Pap smear   . Anemia   . Anxiety   . CHF (congestive heart failure) (HCC)   . History of congestive heart failure    with pulm edema, hyperthyroid dx  . Hyperthyroidism    treated with radioactive iodine now hypothyroid  . Hypothyroidism   . IBS (irritable bowel syndrome)   . Sickle cell trait (HCC)      Family History  Problem Relation Age of Onset  . Arthritis Mother   . Hypertension Mother   . Sickle cell trait Mother   . Diabetes Father   . Hypertension Father   . Heart attack Father   . Sickle cell trait Brother   . Cancer Maternal Aunt        breast  . Cancer Maternal Grandmother        breast  . Cancer Paternal Grandmother      Current Outpatient Medications:  .  busPIRone (BUSPAR) 5 MG tablet, Take 1 tablet (5 mg total) by mouth daily., Disp: 30 tablet, Rfl: 2 .  desogestrel-ethinyl estradiol (AZURETTE) 0.15-0.02/0.01 MG (21/5) tablet, Take 1 tablet by mouth daily., Disp: , Rfl:  .  Levothyroxine Sodium 88 MCG CAPS, Take 88 mcg by mouth daily before breakfast. , Disp: , Rfl:    No Known Allergies   Review of Systems  Respiratory: Negative for shortness of breath.   Cardiovascular: Negative for chest pain.  Gastrointestinal: Negative.   Genitourinary: Negative.   Musculoskeletal: Negative.   Neurological: Negative for dizziness.  Psychiatric/Behavioral: Negative.  Negative for behavioral problems.       Anxiety, currently heightened due to her daughter is  sick     Today's Vitals   11/14/17 0906  BP: 120/68  Pulse: 64  Temp: 98.4 F (36.9 C)  TempSrc: Oral  SpO2: 96%  Weight: 134 lb 3.2 oz (60.9 kg)  Height: 5' (1.524 m)  PainSc: 0-No pain   Body mass index is 26.21 kg/m.   Objective:  Physical Exam  Constitutional: She is oriented to person, place, and time. She appears well-developed and well-nourished.  Cardiovascular: Normal rate.  Pulmonary/Chest: Effort normal and breath sounds normal.  Neurological: She is alert and oriented to person, place, and time.  Skin: Skin is warm and dry.  Psychiatric: She has a normal mood and affect. Her behavior is normal. Judgment and thought content normal.        Assessment And Plan:     1. Anxiety  Slightly better but still having episodes during the day, will increase her buspar.  Will also follow up on her referral to psychology.   I have also sent a Rx for Vistaril as needed. Advised to take when being home may make her sleepy.  - busPIRone (BUSPAR) 10 MG tablet; Take 1 tablet (10 mg total) by mouth 3 (three) times daily.  Dispense: 30 tablet; Refill: 2 - hydrOXYzine (VISTARIL) 25 MG capsule; Take 1 capsule (25  mg total) by mouth 3 (three) times daily as needed.  Dispense: 30 capsule; Refill: 0       Arnette Felts, FNP

## 2017-11-17 ENCOUNTER — Other Ambulatory Visit: Payer: Self-pay | Admitting: Obstetrics and Gynecology

## 2017-11-17 DIAGNOSIS — R928 Other abnormal and inconclusive findings on diagnostic imaging of breast: Secondary | ICD-10-CM

## 2017-11-18 ENCOUNTER — Other Ambulatory Visit: Payer: Self-pay | Admitting: Obstetrics and Gynecology

## 2017-11-18 DIAGNOSIS — R928 Other abnormal and inconclusive findings on diagnostic imaging of breast: Secondary | ICD-10-CM

## 2017-11-23 ENCOUNTER — Ambulatory Visit
Admission: RE | Admit: 2017-11-23 | Discharge: 2017-11-23 | Disposition: A | Discharge status: Home / Self Care | Source: Ambulatory Visit | Attending: Obstetrics and Gynecology | Admitting: Obstetrics and Gynecology

## 2017-11-23 ENCOUNTER — Ambulatory Visit

## 2017-11-23 DIAGNOSIS — R928 Other abnormal and inconclusive findings on diagnostic imaging of breast: Secondary | ICD-10-CM

## 2018-01-06 ENCOUNTER — Encounter: Payer: Self-pay | Admitting: Nurse Practitioner

## 2018-01-06 ENCOUNTER — Ambulatory Visit (INDEPENDENT_AMBULATORY_CARE_PROVIDER_SITE_OTHER): Admitting: Nurse Practitioner

## 2018-01-06 VITALS — BP 132/86 | HR 79 | Temp 98.3°F | Ht 61.4 in | Wt 135.0 lb

## 2018-01-06 DIAGNOSIS — F419 Anxiety disorder, unspecified: Secondary | ICD-10-CM | POA: Diagnosis not present

## 2018-01-06 MED ORDER — BUSPIRONE HCL 10 MG PO TABS: 10.0000 mg | ORAL_TABLET | Freq: Every day | ORAL | 1 refills | Status: DC

## 2018-01-06 NOTE — Progress Notes (Signed)
Subjective:     Patient ID: [name] , female    DOB: October 08, 1975 , 43 y.o.   MRN: [id number]   Chief Complaint  Patient presents with  . Anxiety    Patient states she is here for a med check today.    HPI  She is doing better with the buspar nightly.  She is planning to call   Anxiety  Presents for follow-up visit. Patient reports no chest pain, confusion, dizziness or palpitations. The quality of sleep is good. Nighttime awakenings: none.       Past Medical History:  Diagnosis Date  . Abnormal Pap smear   . Anemia   . Anxiety   . CHF (congestive heart failure) (HCC)   . History of congestive heart failure    with pulm edema, hyperthyroid dx  . Hyperthyroidism    treated with radioactive iodine now hypothyroid  . Hypothyroidism   . IBS (irritable bowel syndrome)   . Sickle cell trait (HCC)      Family History  Problem Relation Age of Onset  . Arthritis Mother   . Hypertension Mother   . Sickle cell trait Mother   . Diabetes Father   . Hypertension Father   . Heart attack Father   . Sickle cell trait Brother   . Cancer Maternal Aunt        breast  . Cancer Maternal Grandmother        breast  . Cancer Paternal Grandmother      Current Outpatient Medications:  .  busPIRone (BUSPAR) 10 MG tablet, Take 1 tablet (10 mg total) by mouth 3 (three) times daily., Disp: 30 tablet, Rfl: 2 .  desogestrel-ethinyl estradiol (AZURETTE) 0.15-0.02/0.01 MG (21/5) tablet, Take 1 tablet by mouth daily., Disp: , Rfl:  .  hydrOXYzine (VISTARIL) 25 MG capsule, Take 1 capsule (25 mg total) by mouth 3 (three) times daily as needed., Disp: 30 capsule, Rfl: 0 .  Levothyroxine Sodium 88 MCG CAPS, Take 88 mcg by mouth daily before breakfast. , Disp: , Rfl:    No Known Allergies   Review of Systems  Respiratory: Negative.   Cardiovascular: Negative.  Negative for chest pain, palpitations and leg swelling.  Gastrointestinal: Negative.   Musculoskeletal: Negative.   Skin:  Negative.   Neurological: Negative for dizziness and headaches.  Psychiatric/Behavioral: Negative for agitation, behavioral problems, confusion and sleep disturbance.     Today's Vitals   01/06/18 1011  BP: 132/86  Pulse: 79  Temp: 98.3 F (36.8 C)  TempSrc: Oral  SpO2: 98%  Weight: 135 lb (61.2 kg)  Height: 5' 1.4" (1.56 m)  PainSc: 0-No pain   Body mass index is 25.18 kg/m.   Objective:  Physical Exam Vitals signs reviewed.  Constitutional:      Appearance: Normal appearance.  Cardiovascular:     Rate and Rhythm: Normal rate and regular rhythm.  Pulmonary:     Effort: Pulmonary effort is normal.     Breath sounds: Normal breath sounds.  Skin:    General: Skin is warm and dry.     Capillary Refill: Capillary refill takes less than 2 seconds.  Neurological:     General: No focal deficit present.     Mental Status: She is alert and oriented to person, place, and time.  Psychiatric:        Mood and Affect: Mood normal.      Assessment And Plan:     1. Anxiety  She is doing better with  the buspirone and taking the Vistaril a few times per week.  Will continue with current dose of buspirone - busPIRone (BUSPAR) 10 MG tablet; Take 1 tablet (10 mg total) by mouth daily.  Dispense: 90 tablet; Refill: 1     Arnette FeltsJanece Latecia Miler, FNP

## 2018-04-07 ENCOUNTER — Ambulatory Visit: Admitting: Nurse Practitioner

## 2018-04-07 ENCOUNTER — Telehealth: Payer: Self-pay | Admitting: Internal Medicine

## 2018-04-07 NOTE — Telephone Encounter (Signed)
PT CONSENT TO VIDEO VISIT FOR DAYDATE@TIME . WEBEX INFO HAS BEEN EMAILED TO PATIENT

## 2018-04-07 NOTE — Telephone Encounter (Signed)
PT CONSENT TO VIDEO VISIT FOR Tuesday 04/11/2018@245PM . WEBEX INFO HAS BEEN EMAILED TO PATIENT

## 2018-04-11 ENCOUNTER — Other Ambulatory Visit: Payer: Self-pay

## 2018-04-11 ENCOUNTER — Ambulatory Visit (INDEPENDENT_AMBULATORY_CARE_PROVIDER_SITE_OTHER): Admitting: Nurse Practitioner

## 2018-04-11 ENCOUNTER — Encounter: Payer: Self-pay | Admitting: Nurse Practitioner

## 2018-04-11 ENCOUNTER — Ambulatory Visit: Admitting: Nurse Practitioner

## 2018-04-11 VITALS — Temp 98.8°F | Wt 135.0 lb

## 2018-04-11 DIAGNOSIS — E039 Hypothyroidism, unspecified: Secondary | ICD-10-CM | POA: Diagnosis not present

## 2018-04-11 DIAGNOSIS — F419 Anxiety disorder, unspecified: Secondary | ICD-10-CM | POA: Diagnosis not present

## 2018-04-11 DIAGNOSIS — N912 Amenorrhea, unspecified: Secondary | ICD-10-CM | POA: Diagnosis not present

## 2018-04-11 DIAGNOSIS — J301 Allergic rhinitis due to pollen: Secondary | ICD-10-CM | POA: Insufficient documentation

## 2018-04-11 MED ORDER — BUSPIRONE HCL 10 MG PO TABS: 10.0000 mg | ORAL_TABLET | Freq: Every day | ORAL | 1 refills | Status: DC

## 2018-04-11 NOTE — Patient Instructions (Signed)

## 2018-04-11 NOTE — Progress Notes (Addendum)
Virtual Visit via Video Note  I connected with [name] on 05/05/18 at  2:45 PM EDT by a video enabled telemedicine application and verified that I am speaking with the correct person using two identifiers.  Subjective:     Patient ID: Erica BucyLatonya E Dougherty , female    DOB: 1975-02-26 , 43 y.o.   MRN: [id number]   Location: Patient: Home Provider: Office   I discussed the limitations of evaluation and management by telemedicine and the availability of in person appointments. The patient expressed understanding and agreed to proceed.  Chief Complaint  Patient presents with  . Hypothyroidism    HPI  Started 2:54pm  She is using her vistaril less than once per week. Thinks the last time she took it last month.  Thought anxiety was coming on a week ago - but was able to calm herself down.  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   Amenorrhea - she [facility] - she has not had a hysterectomy and she is on birth control pills.  She has not had a menstrual   Anxiety  Presents for follow-up visit. Patient reports no dizziness, insomnia, nervous/anxious behavior or palpitations. The quality of sleep is good. Nighttime awakenings: none.    Thyroid Problem  Presents for follow-up (she feels she seen Dr. Talmage NapBalan within the last 6 months.  ) visit. Patient reports no anxiety, fatigue or palpitations. The symptoms have been stable.     Past Medical History:  Diagnosis Date  . Abnormal Pap smear   . Anemia   . Anxiety   . CHF (congestive heart failure) (HCC)   . History of congestive heart failure    with pulm edema, hyperthyroid dx  . Hyperthyroidism    treated with radioactive iodine now hypothyroid  . Hypothyroidism   . IBS (irritable bowel syndrome)   . Sickle cell trait (HCC)      Family History  Problem Relation Age of Onset  . Arthritis Mother   . Hypertension Mother   . Sickle cell trait Mother   . Diabetes Father   .  Hypertension Father   . Heart attack Father   . Sickle cell trait Brother   . Cancer Maternal Aunt        breast  . Cancer Maternal Grandmother        breast  . Cancer Paternal Grandmother      Current Outpatient Medications:  .  busPIRone (BUSPAR) 10 MG tablet, Take 1 tablet (10 mg total) by mouth daily., Disp: 90 tablet, Rfl: 1 .  desogestrel-ethinyl estradiol (AZURETTE) 0.15-0.02/0.01 MG (21/5) tablet, Take 1 tablet by mouth daily., Disp: , Rfl:  .  hydrOXYzine (VISTARIL) 25 MG capsule, Take 1 capsule (25 mg total) by mouth 3 (three) times daily as needed., Disp: 30 capsule, Rfl: 0 .  Levothyroxine Sodium 88 MCG CAPS, Take 88 mcg by mouth daily before breakfast. , Disp: , Rfl:    No Known Allergies   Review of Systems  Constitutional: Negative for chills and fatigue.  Respiratory: Negative.   Cardiovascular: Negative.  Negative for palpitations and leg swelling.  Neurological: Negative.  Negative for dizziness and headaches.  Psychiatric/Behavioral: The patient is not nervous/anxious and does not have insomnia.      Today's Vitals   04/11/18 1448  Temp: 98.8 F (37.1 C)  TempSrc: Oral  Weight: 135 lb (61.2 kg)   Body mass index is 25.18 kg/m.   Objective:  Physical Exam Vitals signs reviewed.  Constitutional:      Appearance: Normal appearance.  Cardiovascular:     Rate and Rhythm: Normal rate and regular rhythm.     Pulses: Normal pulses.     Heart sounds: Normal heart sounds.  Pulmonary:     Effort: Pulmonary effort is normal.     Breath sounds: Normal breath sounds.  Neurological:     General: No focal deficit present.     Mental Status: She is alert.  Psychiatric:        Mood and Affect: Mood normal.        Behavior: Behavior normal.        Thought Content: Thought content normal.        Judgment: Judgment normal.         Assessment And Plan:     1. Anxiety  She is doing well with the buspirone limited use of vistaril.   2. Amenorrhea  She  has not had a menstrual cycle in 2 months, I have advised to at least take a pregnancy test and to contact her GYN.  3. Seasonal allergic rhinitis due to pollen  Encouraged to take an over the counter antihistamine.    4. Acquired hypothyroidism  Chronic  Being followed by Dr Talmage Nap with a recent visit in the last 6 months    Arnette Felts, FNP  I Spent total of 12 minutes non face to face time with patient  THE PATIENT IS ENCOURAGED TO PRACTICE SOCIAL DISTANCING DUE TO THE COVID-19 PANDEMIC.

## 2018-04-24 IMAGING — MG 2D DIGITAL DIAGNOSTIC UNILATERAL LEFT MAMMOGRAM WITH CAD AND ADJ
6 series · 6 of 14 positions shown · non-contrast
Comparison: Previous exam(s).

CLINICAL DATA: Recall from screening mammography

EXAM:
2D DIGITAL DIAGNOSTIC UNILATERAL LEFT MAMMOGRAM WITH CAD AND ADJUNCT
TOMO

[L CC]
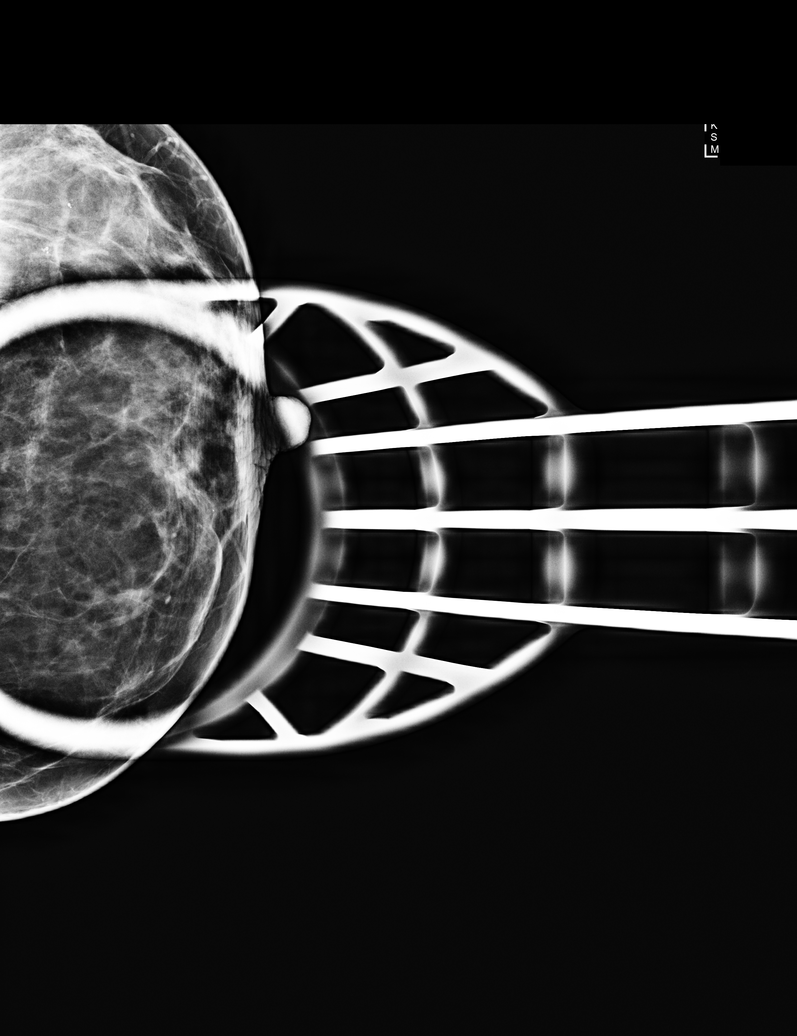

[L CC synth-2D]
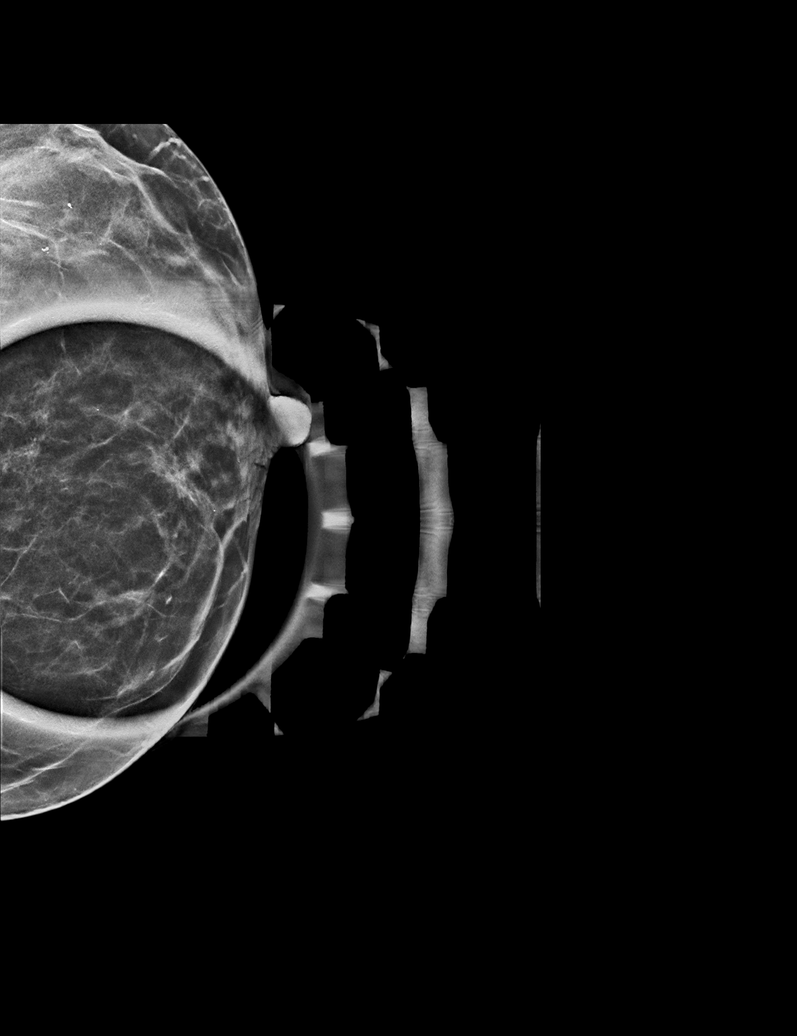

[L MLO synth-2D]
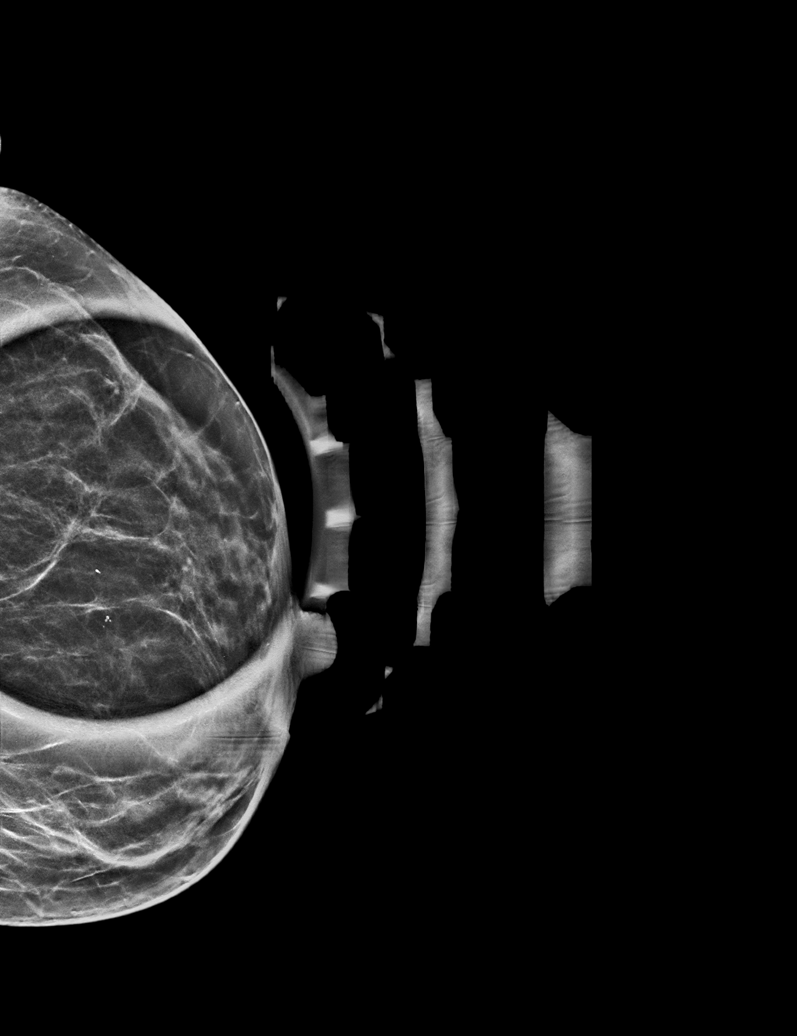

[L MLO]
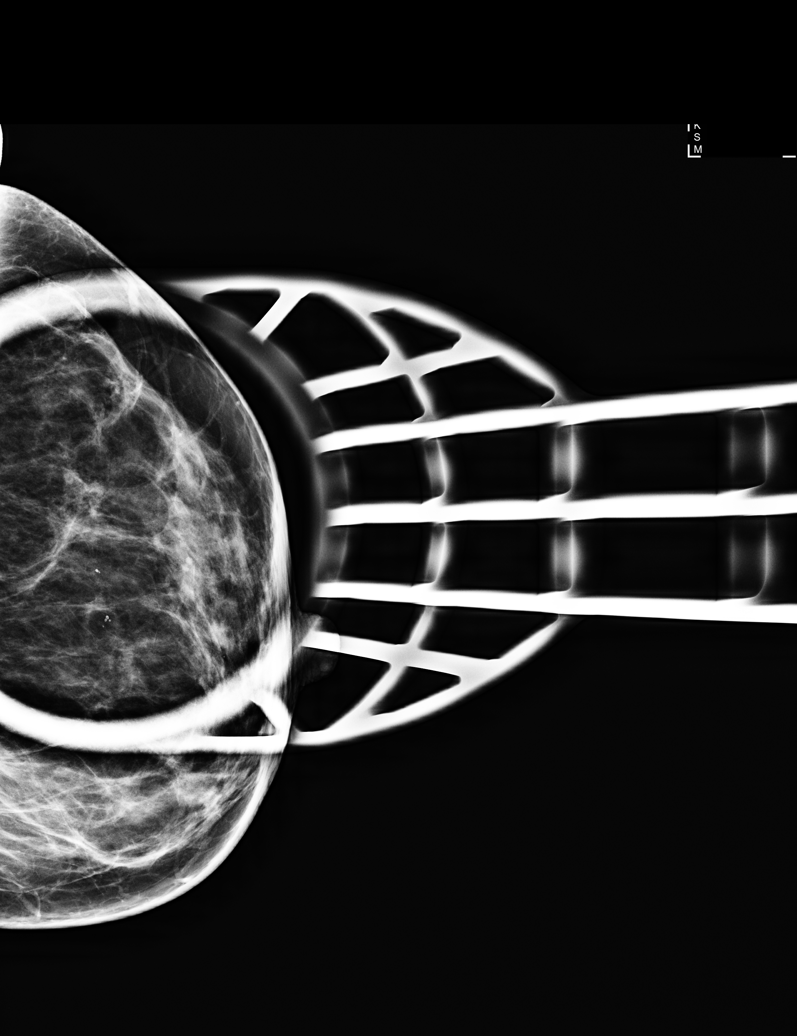

[L MLO tomo · tomo slice 25/50.0]
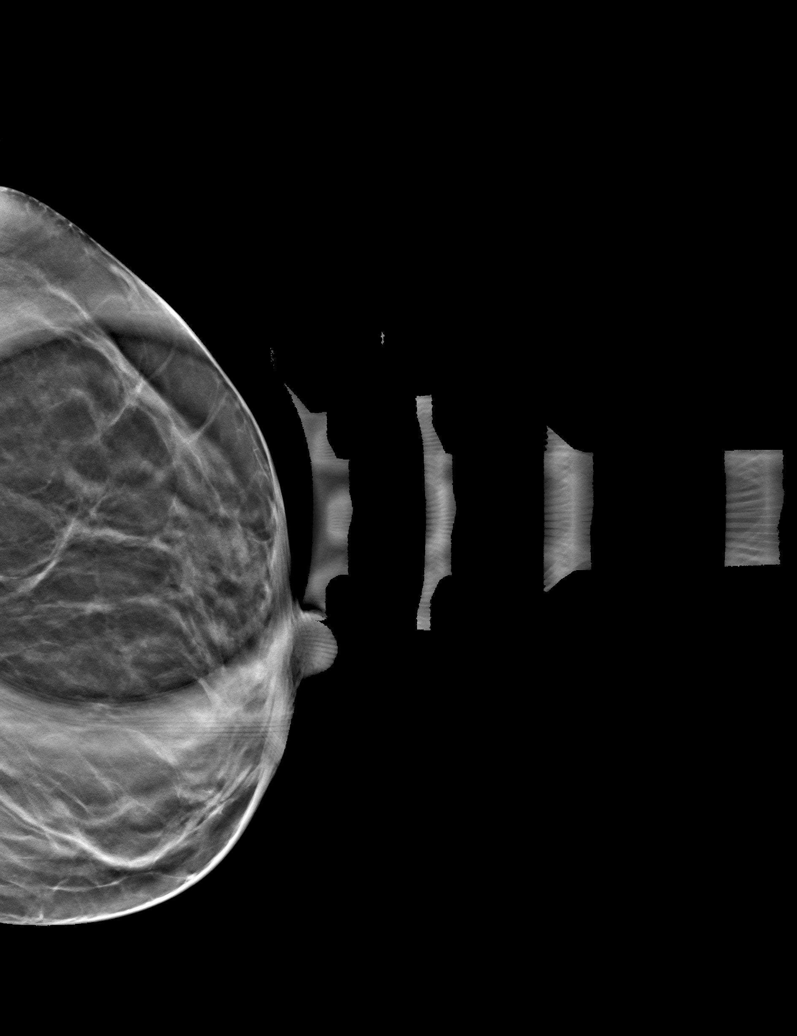

[L CC tomo · tomo slice 22/43.0]
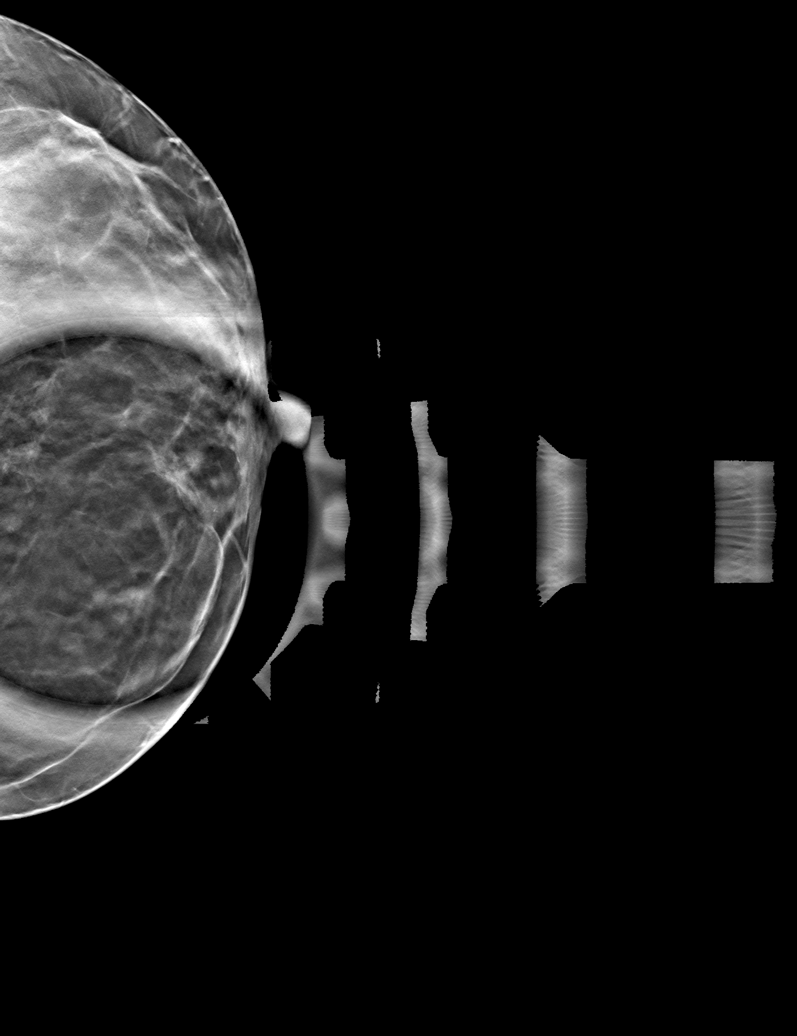

[6 of 14 positions shown; findings below may reference images not displayed]

ACR Breast Density Category c: The breast tissue is heterogeneously
dense, which may obscure small masses.
FINDINGS: Additional views of the left breast demonstrate no persistent
abnormality. The appearance is consistent with a summation shadow.

Mammographic images were processed with CAD.
IMPRESSION: No persistent abnormality on additional evaluation of the left
breast.

RECOMMENDATION:
Screening mammography 1 year.

I have discussed the findings and recommendations with the patient.
Results were also provided in writing at the conclusion of the
visit. If applicable, a reminder letter will be sent to the patient
regarding the next appointment.

BI-RADS CATEGORY  1: Negative.

## 2018-09-21 ENCOUNTER — Other Ambulatory Visit: Payer: Self-pay

## 2018-09-21 DIAGNOSIS — Z20822 Contact with and (suspected) exposure to covid-19: Secondary | ICD-10-CM

## 2018-09-23 LAB — NOVEL CORONAVIRUS, NAA: SARS-CoV-2, NAA: NOT DETECTED

## 2018-10-31 ENCOUNTER — Encounter: Payer: Self-pay | Admitting: Nurse Practitioner

## 2018-10-31 LAB — HM PAP SMEAR: HM Pap smear: ABNORMAL

## 2019-05-23 ENCOUNTER — Other Ambulatory Visit

## 2019-07-16 IMAGING — CR DG SHOULDER 2+V*L*
3 series · 3 of 3 positions shown · non-contrast
Comparison: None.

CLINICAL DATA: Pain following fall

EXAM:
LEFT SHOULDER - 2+ VIEW

[w shoulder grashey left]
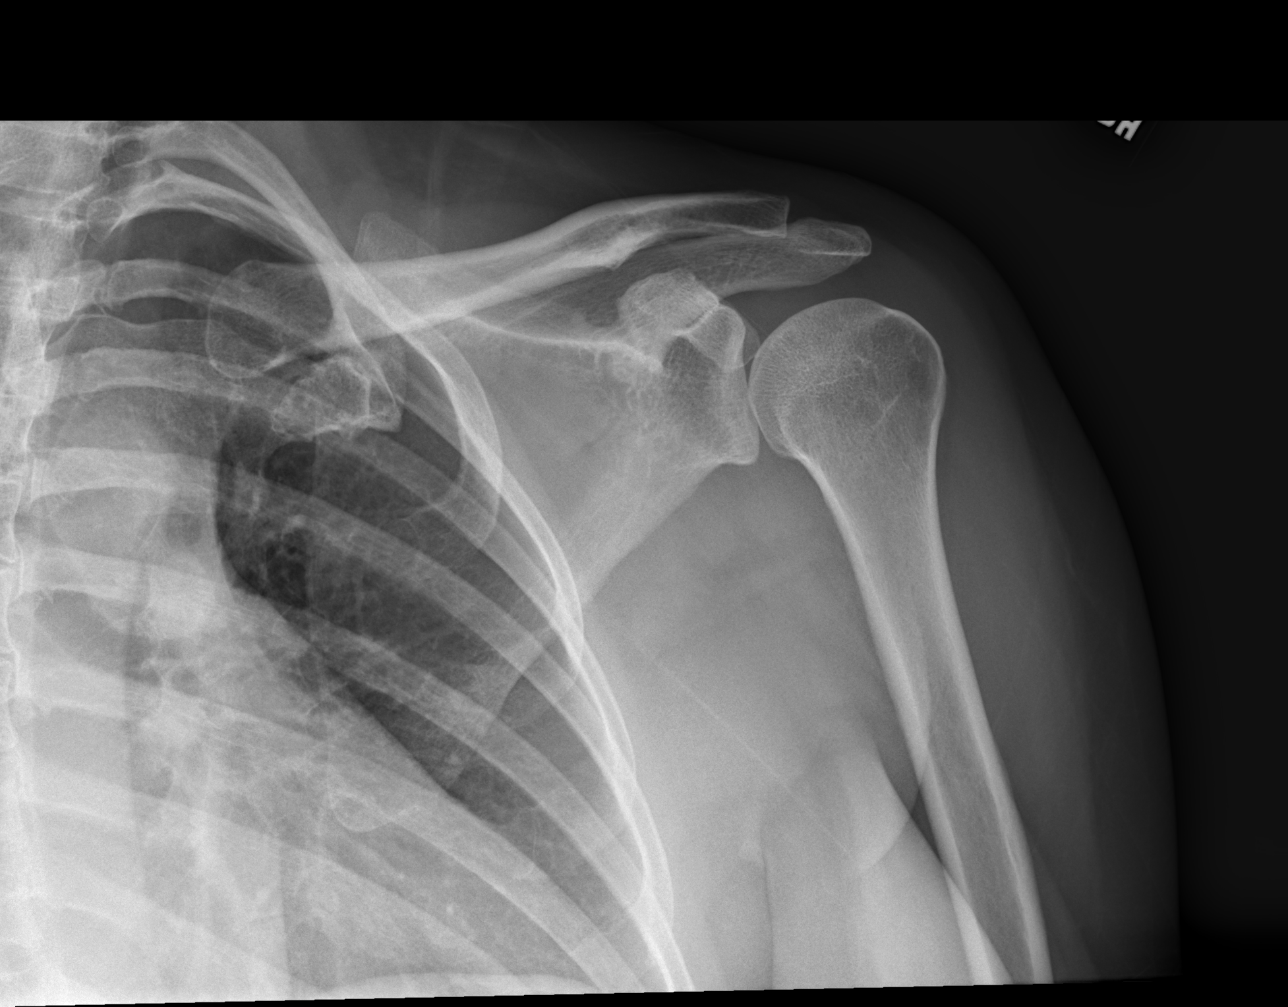

[w shoulder y-view left]
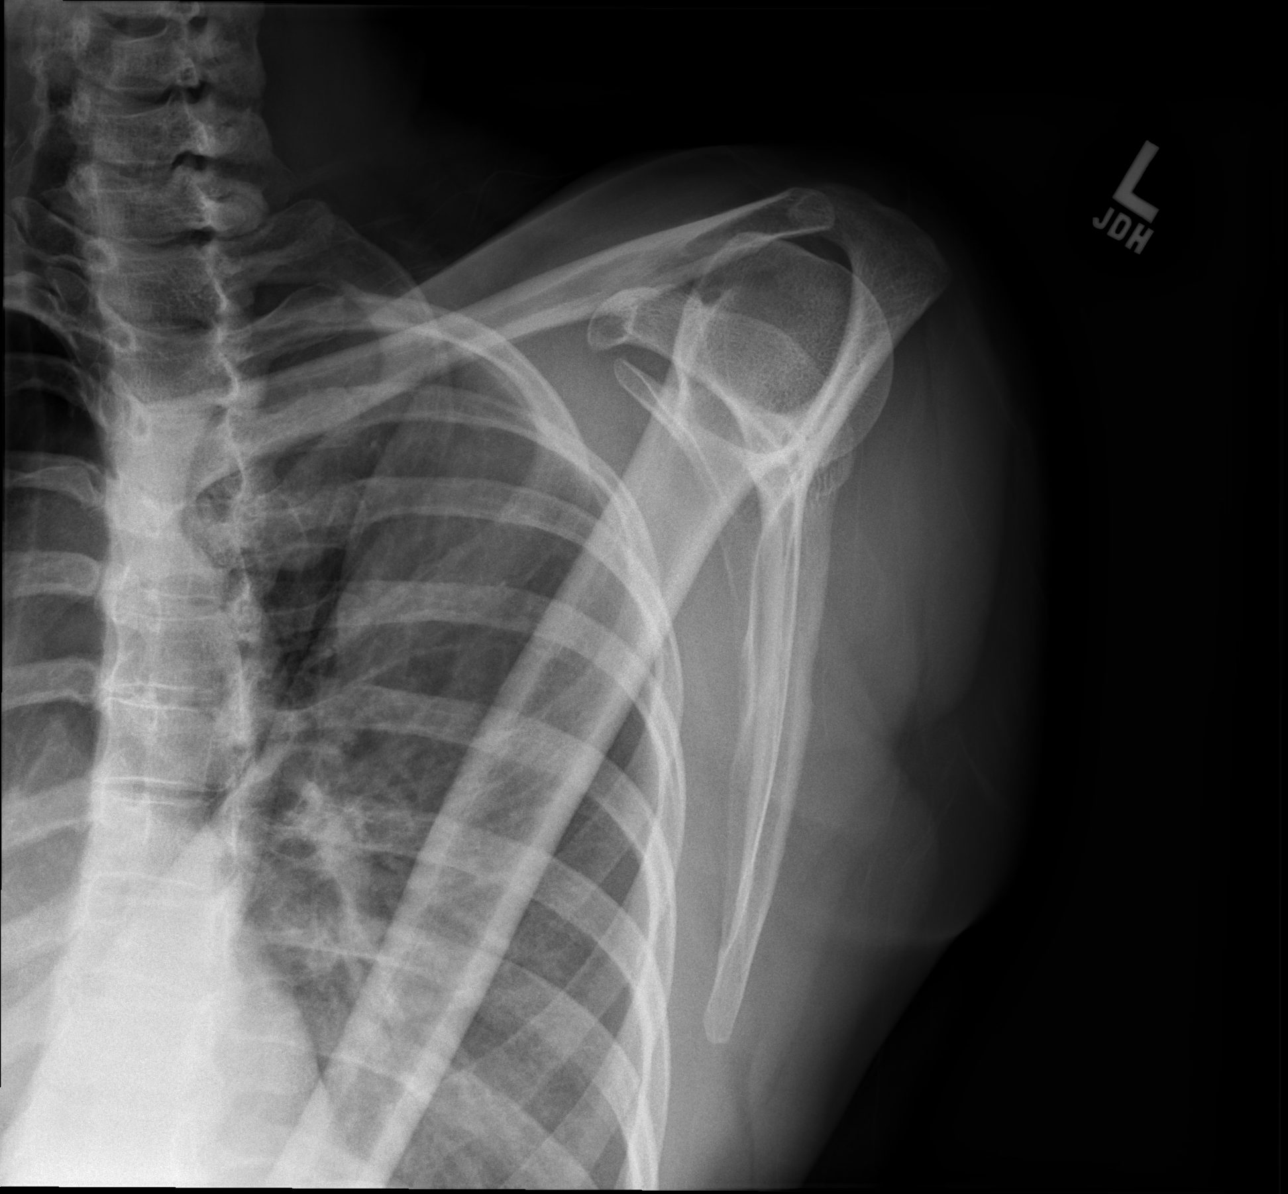

[w shoulder axillary left]
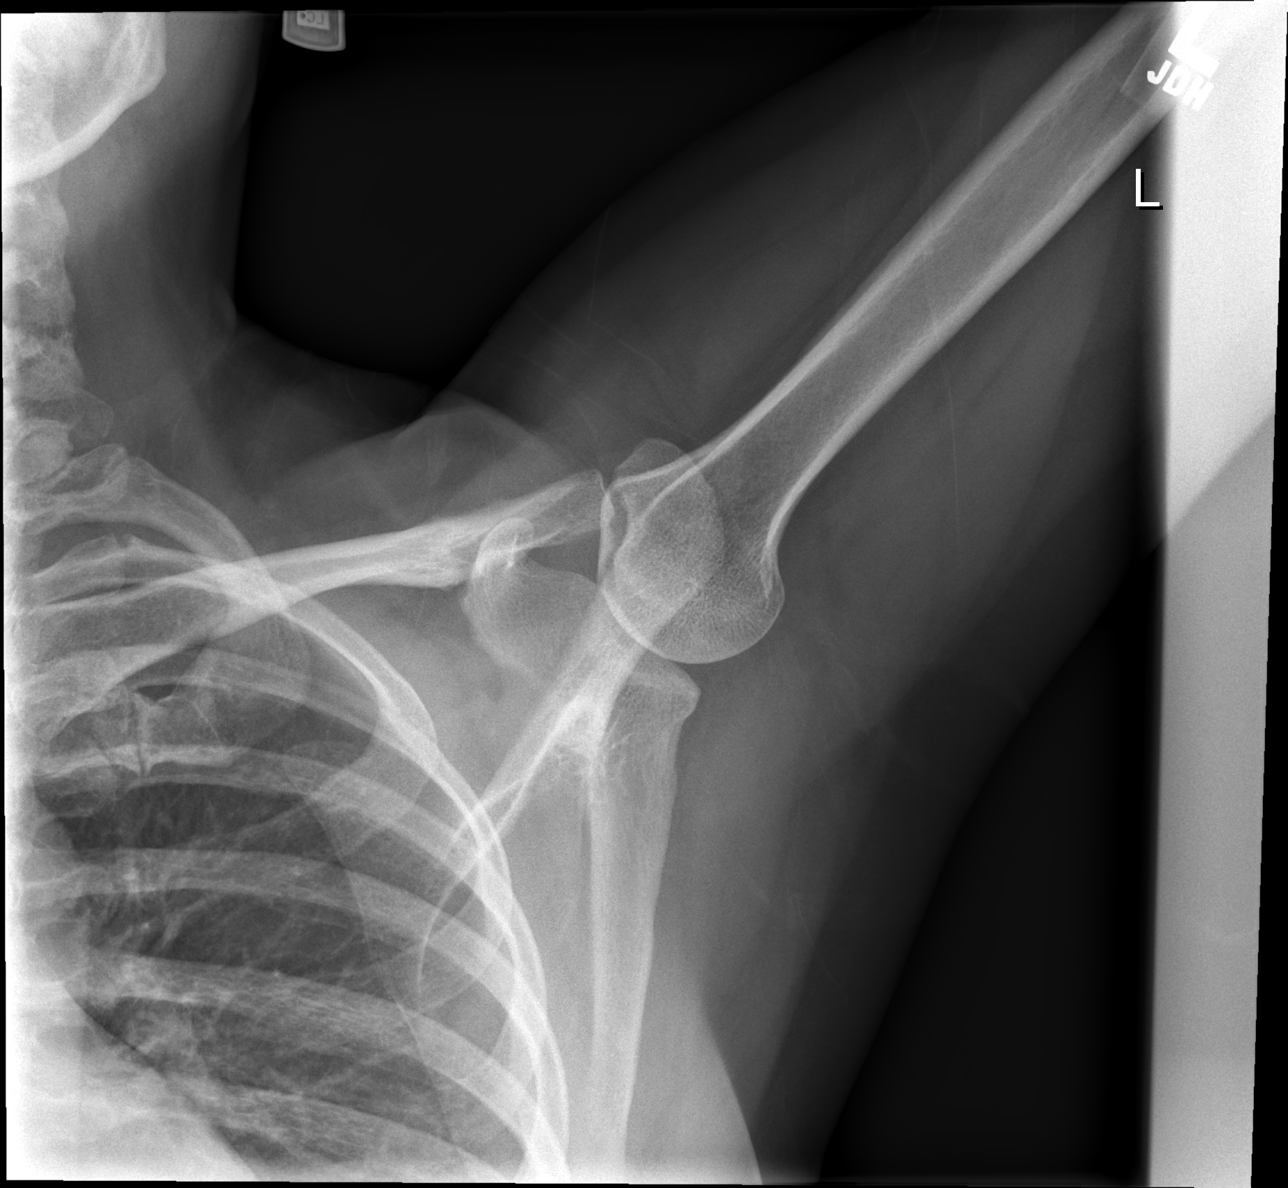

[3 of 3 positions shown; findings below may reference images not displayed]

FINDINGS: Frontal, Y scapular, axillary images were obtained. No evident
fracture or dislocation. Joint spaces appear normal. No erosive
change. Visualized left lung clear.
IMPRESSION: No fracture or dislocation.  No evident arthropathy.

## 2019-07-16 IMAGING — CR DG ANKLE COMPLETE 3+V*L*
3 series · 3 of 3 positions shown · non-contrast
Comparison: None.

CLINICAL DATA: Fall 1 month ago with persistent lateral sided ankle
pain. Evaluate for fracture.

EXAM:
LEFT ANKLE COMPLETE - 3+ VIEW

[x ankle ap left]
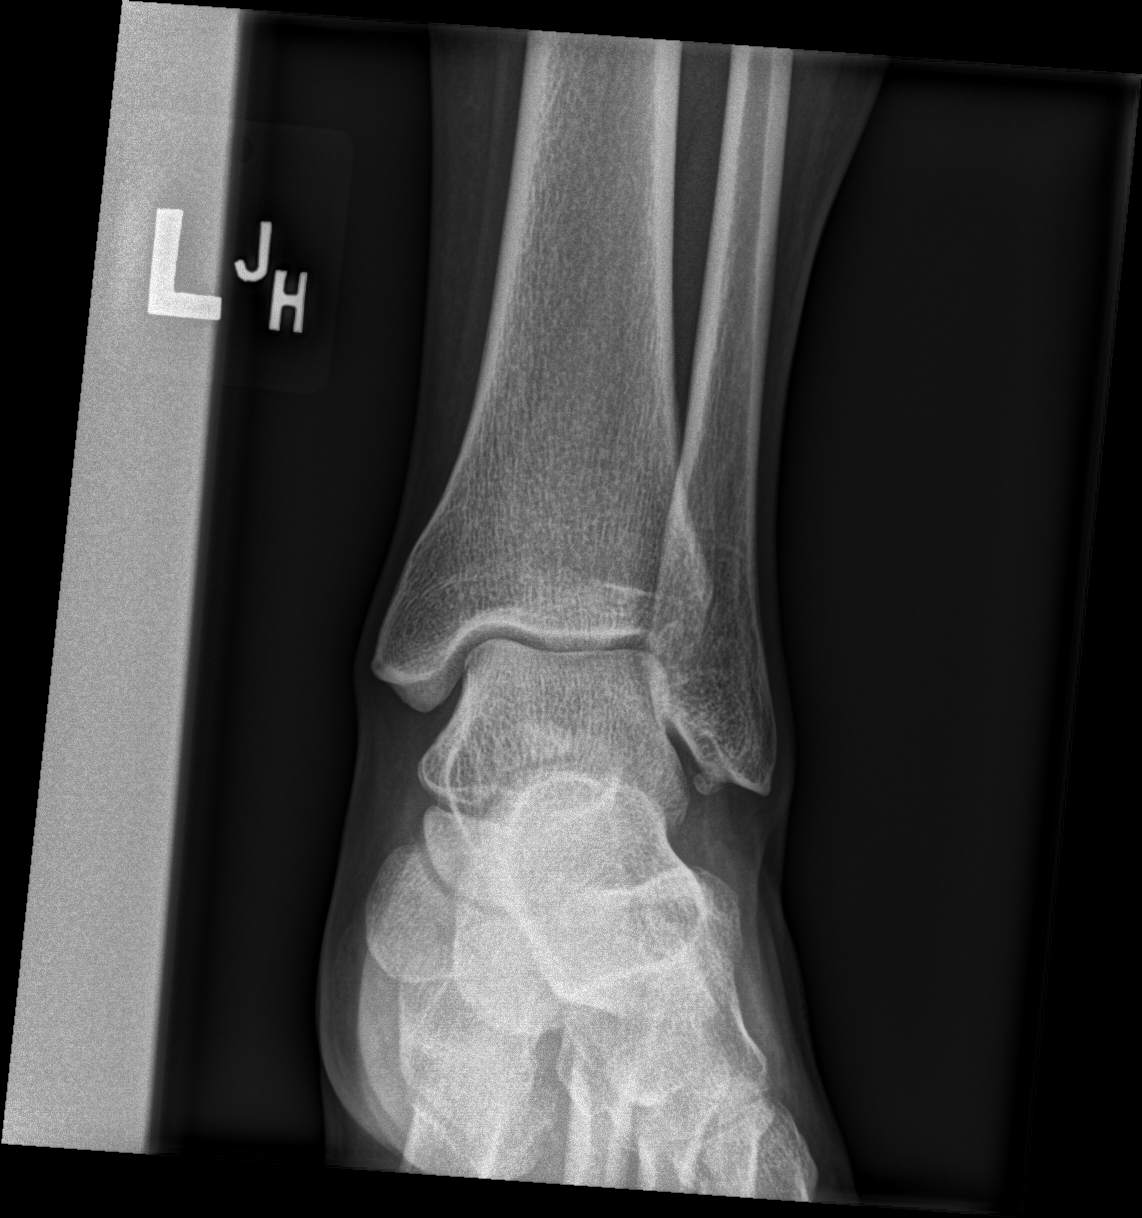

[x ankle obl left]
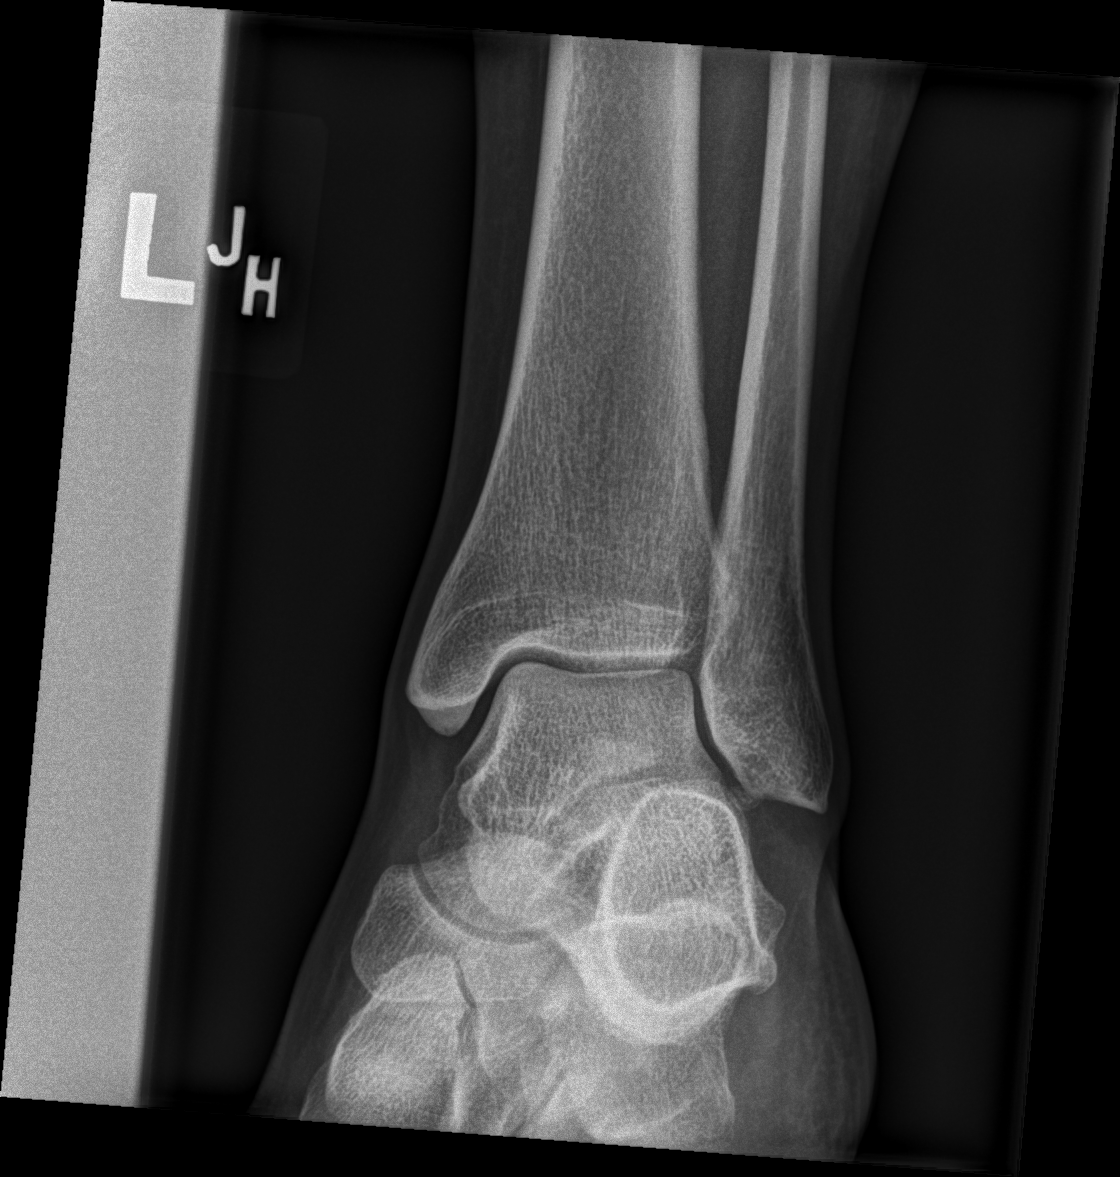

[x ankle lat left]
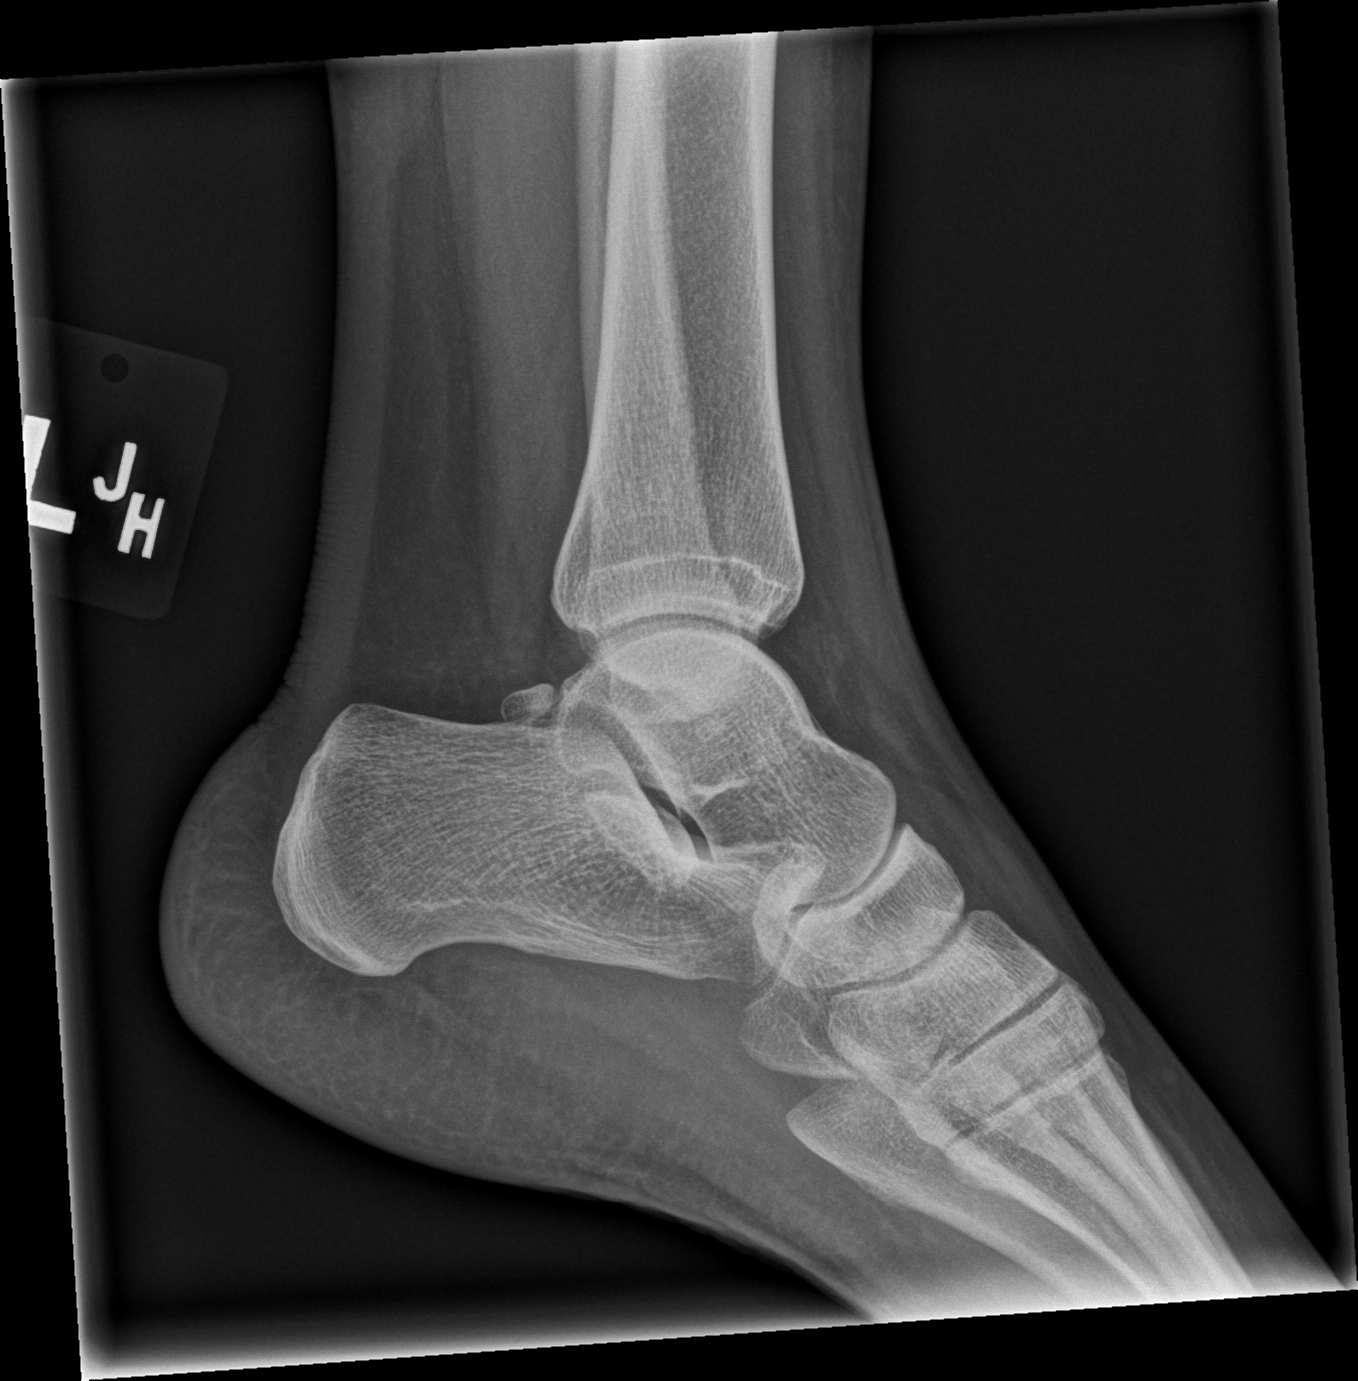

[3 of 3 positions shown; findings below may reference images not displayed]

FINDINGS: There is a peripherally corticated ossicle adjacent to the distal
tip of the fibula likely the sequela of remote avulsive injury. Note
is made of a small os trigonum. Joint spaces are preserved. Ankle
mortise is preserved. No definite ankle joint effusion. Regional
soft tissues appear normal.
IMPRESSION: 1. No acute findings.
2. Tiny ossicle adjacent to the distal end of the fibula likely
represents the sequela of remote avulsive injury.

## 2019-08-28 ENCOUNTER — Other Ambulatory Visit

## 2019-08-28 DIAGNOSIS — Z20822 Contact with and (suspected) exposure to covid-19: Secondary | ICD-10-CM

## 2019-08-29 LAB — SARS-COV-2, NAA 2 DAY TAT

## 2019-08-29 LAB — NOVEL CORONAVIRUS, NAA: SARS-CoV-2, NAA: NOT DETECTED

## 2020-02-06 ENCOUNTER — Ambulatory Visit (INDEPENDENT_AMBULATORY_CARE_PROVIDER_SITE_OTHER): Admitting: Nurse Practitioner

## 2020-02-06 ENCOUNTER — Encounter: Payer: Self-pay | Admitting: Nurse Practitioner

## 2020-02-06 ENCOUNTER — Other Ambulatory Visit: Payer: Self-pay

## 2020-02-06 VITALS — BP 118/76 | HR 80 | Temp 98.3°F | Ht 61.0 in | Wt 144.2 lb

## 2020-02-06 DIAGNOSIS — E039 Hypothyroidism, unspecified: Secondary | ICD-10-CM

## 2020-02-06 DIAGNOSIS — F419 Anxiety disorder, unspecified: Secondary | ICD-10-CM | POA: Diagnosis not present

## 2020-02-06 DIAGNOSIS — Z1159 Encounter for screening for other viral diseases: Secondary | ICD-10-CM

## 2020-02-06 DIAGNOSIS — Z0000 Encounter for general adult medical examination without abnormal findings: Secondary | ICD-10-CM | POA: Diagnosis not present

## 2020-02-06 DIAGNOSIS — Z1211 Encounter for screening for malignant neoplasm of colon: Secondary | ICD-10-CM

## 2020-02-06 MED ORDER — BUSPIRONE HCL 10 MG PO TABS: 10.0000 mg | ORAL_TABLET | Freq: Every day | ORAL | 1 refills | Status: DC

## 2020-02-06 NOTE — Patient Instructions (Signed)
Health Maintenance, Female Adopting a healthy lifestyle and getting preventive care are important in promoting health and wellness. Ask your health care provider about:  The right schedule for you to have regular tests and exams.  Things you can do on your own to prevent diseases and keep yourself healthy. What should I know about diet, weight, and exercise? Eat a healthy diet  Eat a diet that includes plenty of vegetables, fruits, low-fat dairy products, and lean protein.  Do not eat a lot of foods that are high in solid fats, added sugars, or sodium.   Maintain a healthy weight Body mass index (BMI) is used to identify weight problems. It estimates body fat based on height and weight. Your health care provider can help determine your BMI and help you achieve or maintain a healthy weight. Get regular exercise Get regular exercise. This is one of the most important things you can do for your health. Most adults should:  Exercise for at least 150 minutes each week. The exercise should increase your heart rate and make you sweat (moderate-intensity exercise).  Do strengthening exercises at least twice a week. This is in addition to the moderate-intensity exercise.  Spend less time sitting. Even light physical activity can be beneficial. Watch cholesterol and blood lipids Have your blood tested for lipids and cholesterol at 47 years of age, then have this test every 5 years. Have your cholesterol levels checked more often if:  Your lipid or cholesterol levels are high.  You are older than 47 years of age.  You are at high risk for heart disease. What should I know about cancer screening? Depending on your health history and family history, you may need to have cancer screening at various ages. This may include screening for:  Breast cancer.  Cervical cancer.  Colorectal cancer.  Skin cancer.  Lung cancer. What should I know about heart disease, diabetes, and high blood  pressure? Blood pressure and heart disease  High blood pressure causes heart disease and increases the risk of stroke. This is more likely to develop in people who have high blood pressure readings, are of African descent, or are overweight.  Have your blood pressure checked: ? Every 3-5 years if you are 18-39 years of age. ? Every year if you are 40 years old or older. Diabetes Have regular diabetes screenings. This checks your fasting blood sugar level. Have the screening done:  Once every three years after age 40 if you are at a normal weight and have a low risk for diabetes.  More often and at a younger age if you are overweight or have a high risk for diabetes. What should I know about preventing infection? Hepatitis B If you have a higher risk for hepatitis B, you should be screened for this virus. Talk with your health care provider to find out if you are at risk for hepatitis B infection. Hepatitis C Testing is recommended for:  Everyone born from 1945 through 1965.  Anyone with known risk factors for hepatitis C. Sexually transmitted infections (STIs)  Get screened for STIs, including gonorrhea and chlamydia, if: ? You are sexually active and are younger than 47 years of age. ? You are older than 47 years of age and your health care provider tells you that you are at risk for this type of infection. ? Your sexual activity has changed since you were last screened, and you are at increased risk for chlamydia or gonorrhea. Ask your health care provider   if you are at risk.  Ask your health care provider about whether you are at high risk for HIV. Your health care provider may recommend a prescription medicine to help prevent HIV infection. If you choose to take medicine to prevent HIV, you should first get tested for HIV. You should then be tested every 3 months for as long as you are taking the medicine. Pregnancy  If you are about to stop having your period (premenopausal) and  you may become pregnant, seek counseling before you get pregnant.  Take 400 to 800 micrograms (mcg) of folic acid every day if you become pregnant.  Ask for birth control (contraception) if you want to prevent pregnancy. Osteoporosis and menopause Osteoporosis is a disease in which the bones lose minerals and strength with aging. This can result in bone fractures. If you are 65 years old or older, or if you are at risk for osteoporosis and fractures, ask your health care provider if you should:  Be screened for bone loss.  Take a calcium or vitamin D supplement to lower your risk of fractures.  Be given hormone replacement therapy (HRT) to treat symptoms of menopause. Follow these instructions at home: Lifestyle  Do not use any products that contain nicotine or tobacco, such as cigarettes, e-cigarettes, and chewing tobacco. If you need help quitting, ask your health care provider.  Do not use street drugs.  Do not share needles.  Ask your health care provider for help if you need support or information about quitting drugs. Alcohol use  Do not drink alcohol if: ? Your health care provider tells you not to drink. ? You are pregnant, may be pregnant, or are planning to become pregnant.  If you drink alcohol: ? Limit how much you use to 0-1 drink a day. ? Limit intake if you are breastfeeding.  Be aware of how much alcohol is in your drink. In the [place] one drink equals one 12 oz bottle of beer (355 mL), one 5 oz glass of wine (148 mL), or one 1 oz glass of hard liquor (44 mL). General instructions  Schedule regular health, dental, and eye exams.  Stay current with your vaccines.  Tell your health care provider if: ? You often feel depressed. ? You have ever been abused or do not feel safe at home. Summary  Adopting a healthy lifestyle and getting preventive care are important in promoting health and wellness.  Follow your health care provider's instructions about healthy  diet, exercising, and getting tested or screened for diseases.  Follow your health care provider's instructions on monitoring your cholesterol and blood pressure. This information is not intended to replace advice given to you by your health care provider. Make sure you discuss any questions you have with your health care provider. Document Revised: 12/14/2017 Document Reviewed: 12/14/2017 Elsevier Patient Education  2021 Elsevier Inc.  

## 2020-02-06 NOTE — Progress Notes (Signed)
This visit occurred during the SARS-CoV-2 public health emergency.  Safety protocols were in place, including screening questions prior to the visit, additional usage of staff PPE, and extensive cleaning of exam room while observing appropriate contact time as indicated for disinfecting solutions.  Subjective:     Patient ID: [name] , female    DOB: Jul 20, 1975 , 45 y.o.   MRN: [id number]   Chief Complaint  Patient presents with  . Annual Exam    HPI  Pt is here today for her physical. She goes to [facility] she is currently not due for a pap until next year.  She is also taking buspirone (which she feels hungry when she takes this medication).  She has also had her thyroid levels to be elevated and now on 100 mcg daily done by Dr. Chalmers Cater (last seen in Sept 2021).    Wt Readings from Last 3 Encounters: 02/06/20 : 144 lb 3.2 oz (65.4 kg) 04/11/18 : 135 lb (61.2 kg) 01/06/18 : 135 lb (61.2 kg)     Past Medical History:  Diagnosis Date  . Abnormal Pap smear   . Anemia   . Anxiety   . CHF (congestive heart failure) ([place])   . History of congestive heart failure    with pulm edema, hyperthyroid dx  . Hyperthyroidism    treated with radioactive iodine now hypothyroid  . Hypothyroidism   . IBS (irritable bowel syndrome)   . Sickle cell trait ([facility])      Family History  Problem Relation Age of Onset  . Arthritis Mother   . Hypertension Mother   . Sickle cell trait Mother   . Diabetes Father   . Hypertension Father   . Heart attack Father   . Sickle cell trait Brother   . Cancer Maternal Aunt        breast  . Cancer Maternal Grandmother        breast  . Cancer Paternal Grandmother      Current Outpatient Medications:  .  desogestrel-ethinyl estradiol (MIRCETTE) 0.15-0.02/0.01 MG (21/5) tablet, Take 1 tablet by mouth daily., Disp: , Rfl:  .  hydrOXYzine (VISTARIL) 25 MG capsule, Take 1 capsule (25 mg total) by mouth 3 (three) times daily as needed., Disp:  30 capsule, Rfl: 0 .  Levothyroxine Sodium 88 MCG CAPS, Take 88 mcg by mouth daily before breakfast. , Disp: , Rfl:  .  busPIRone (BUSPAR) 10 MG tablet, Take 1 tablet (10 mg total) by mouth daily., Disp: 90 tablet, Rfl: 1   No Known Allergies    The patient states she uses OCP (estrogen/progesterone) for birth control. Irregular periods. Negative for Dysmenorrhea and Negative for Menorrhagia. Negative for: breast discharge, breast lump(s), breast pain and breast self exam. Associated symptoms include abnormal vaginal bleeding. Pertinent negatives include abnormal bleeding (hematology), anxiety, decreased libido, depression, difficulty falling sleep, dyspareunia, history of infertility, nocturia, sexual dysfunction, sleep disturbances, urinary incontinence, urinary urgency, vaginal discharge and vaginal itching. Diet regular.The patient states her exercise level is minimal   The patient's tobacco use is:  Social History   Tobacco Use  Smoking Status Never Smoker  Smokeless Tobacco Never Used   She has been exposed to passive smoke. The patient's alcohol use is:  Social History   Substance and Sexual Activity  Alcohol Use No  . Additional information: Last pap 10/31/2018, next one scheduled for 10/30/2021.    Review of Systems  Constitutional: Negative.  Negative for fatigue.  HENT: Negative.  Endocrine: Negative for polydipsia, polyphagia and polyuria.  Musculoskeletal: Negative.   Skin: Negative.   Neurological: Negative for dizziness and headaches.  Psychiatric/Behavioral: Negative.      Today's Vitals   02/06/20 0917  BP: 118/76  Pulse: 80  Temp: 98.3 F (36.8 C)  TempSrc: Oral  Weight: 144 lb 3.2 oz (65.4 kg)  Height: 5' 1"  (1.549 m)   Body mass index is 27.25 kg/m.  Wt Readings from Last 3 Encounters:  02/06/20 144 lb 3.2 oz (65.4 kg)  04/11/18 135 lb (61.2 kg)  01/06/18 135 lb (61.2 kg)   Objective:  Physical Exam Vitals reviewed.  Constitutional:       General: She is not in acute distress.    Appearance: Normal appearance. She is well-developed. She is obese.  HENT:     Head: Normocephalic and atraumatic.     Right Ear: Hearing, tympanic membrane, ear canal and external ear normal. There is no impacted cerumen.     Left Ear: Hearing, tympanic membrane, ear canal and external ear normal. There is no impacted cerumen.     Nose:     Comments: Deferred - masked    Mouth/Throat:     Comments: Deferred - masked Eyes:     General: Lids are normal.     Extraocular Movements: Extraocular movements intact.     Conjunctiva/sclera: Conjunctivae normal.     Pupils: Pupils are equal, round, and reactive to light.     Funduscopic exam:    Right eye: No papilledema.        Left eye: No papilledema.  Neck:     Thyroid: No thyroid mass.     Vascular: No carotid bruit.  Cardiovascular:     Rate and Rhythm: Normal rate and regular rhythm.     Pulses: Normal pulses.     Heart sounds: Normal heart sounds. No murmur heard.   Pulmonary:     Effort: Pulmonary effort is normal. No respiratory distress.     Breath sounds: Normal breath sounds. No wheezing.  Chest:     Chest wall: No mass.  Breasts:     Tanner Score is 5.     Right: Normal. No mass, tenderness, axillary adenopathy or supraclavicular adenopathy.     Left: Normal. No mass, tenderness, axillary adenopathy or supraclavicular adenopathy.    Abdominal:     General: Abdomen is flat. Bowel sounds are normal. There is no distension.     Palpations: Abdomen is soft.     Tenderness: There is no abdominal tenderness.  Musculoskeletal:        General: No swelling or tenderness. Normal range of motion.     Cervical back: Full passive range of motion without pain, normal range of motion and neck supple.     Right lower leg: No edema.     Left lower leg: No edema.  Lymphadenopathy:     Upper Body:     Right upper body: No supraclavicular, axillary or pectoral adenopathy.     Left upper  body: No supraclavicular, axillary or pectoral adenopathy.  Skin:    General: Skin is warm and dry.     Capillary Refill: Capillary refill takes less than 2 seconds.  Neurological:     General: No focal deficit present.     Mental Status: She is alert and oriented to person, place, and time.     Cranial Nerves: No cranial nerve deficit.     Sensory: No sensory deficit.  Psychiatric:  Mood and Affect: Mood normal.        Behavior: Behavior normal.        Thought Content: Thought content normal.        Judgment: Judgment normal.         Assessment And Plan:     1. Encounter for general adult medical examination w/o abnormal findings . Behavior modifications discussed and diet history reviewed.   . Pt will continue to exercise regularly and modify diet with low GI, plant based foods and decrease intake of processed foods.  . Recommend intake of daily multivitamin, Vitamin D, and calcium.  . Recommend mammogram (up to date) and colonoscopy for preventive screenings, as well as recommend immunizations that include influenza, TDAP (up to date) - CBC - CMP14+EGFR  2. Acquired hypothyroidism  Chronic, controlled  Continue with current medications, tolerating well  Continue follow up with Dr. Chalmers Cater  3. Anxiety  Chronic, controlled  Continue with current medications, she is doing well with this medication - busPIRone (BUSPAR) 10 MG tablet; Take 1 tablet (10 mg total) by mouth daily.  Dispense: 90 tablet; Refill: 1  4. Encounter for hepatitis C screening test for low risk patient  Will check Hepatitis C screening due to recent recommendations to screen all adults 18 years and older - Hepatitis C antibody  5. Encounter for screening colonoscopy  According to USPTF Colorectal cancer Screening guidelines. Colonoscopy is recommended every 10 years, starting at age 73 years.  Will refer to GI for colon cancer screening. - Ambulatory referral to  Gastroenterology     Patient was given opportunity to ask questions. Patient verbalized understanding of the plan and was able to repeat key elements of the plan. All questions were answered to their satisfaction.   Minette Brine, FNP   I, Minette Brine, FNP, have reviewed all documentation for this visit. The documentation on 02/13/20 for the exam, diagnosis, procedures, and orders are all accurate and complete.   THE PATIENT IS ENCOURAGED TO PRACTICE SOCIAL DISTANCING DUE TO THE COVID-19 PANDEMIC.

## 2020-02-08 LAB — CMP14+EGFR
ALT: 10 IU/L (ref 0–32)
AST: 18 IU/L (ref 0–40)
Albumin/Globulin Ratio: 1.4 (ref 1.2–2.2)
Albumin: 4.6 g/dL (ref 3.8–4.8)
Alkaline Phosphatase: 67 IU/L (ref 44–121)
BUN/Creatinine Ratio: 7 — ABNORMAL LOW (ref 9–23)
BUN: 6 mg/dL (ref 6–24)
Bilirubin Total: 0.8 mg/dL (ref 0.0–1.2)
CO2: 20 mmol/L (ref 20–29)
Calcium: 9.6 mg/dL (ref 8.7–10.2)
Chloride: 102 mmol/L (ref 96–106)
Creatinine, Ser: 0.83 mg/dL (ref 0.57–1.00)
GFR calc Af Amer: 98 mL/min/{1.73_m2} (ref >59)
GFR calc non Af Amer: 85 mL/min/{1.73_m2} (ref >59)
Globulin, Total: 3.3 g/dL (ref 1.5–4.5)
Glucose: 79 mg/dL (ref 65–99)
Potassium: 4.1 mmol/L (ref 3.5–5.2)
Sodium: 139 mmol/L (ref 134–144)
Total Protein: 7.9 g/dL (ref 6.0–8.5)

## 2020-02-08 LAB — CBC
Hematocrit: 39 % (ref 34.0–46.6)
Hemoglobin: 12.9 g/dL (ref 11.1–15.9)
MCH: 30.3 pg (ref 26.6–33.0)
MCHC: 33.1 g/dL (ref 31.5–35.7)
MCV: 92 fL (ref 79–97)
Platelets: 381 10*3/uL (ref 150–450)
RBC: 4.26 x10E6/uL (ref 3.77–5.28)
RDW: 12.3 % (ref 11.7–15.4)
WBC: 5.8 10*3/uL (ref 3.4–10.8)

## 2020-02-08 LAB — HEPATITIS C ANTIBODY: Hep C Virus Ab: <0.1 s/co ratio (ref 0.0–0.9)

## 2020-02-28 LAB — TSH: TSH: 0.19 — AB (ref 0.41–5.90)

## 2020-03-20 ENCOUNTER — Telehealth: Payer: Self-pay

## 2020-03-20 ENCOUNTER — Encounter: Payer: Self-pay | Admitting: Nurse Practitioner

## 2020-03-20 NOTE — Telephone Encounter (Signed)
Noted  

## 2020-03-20 NOTE — Telephone Encounter (Signed)
Patient returned our call regarding her  Thyroid being overproductive. she stated she is aware and has gone to see her endocrinologist and they have adjusted her dose. YL,RMA

## 2020-03-20 NOTE — Telephone Encounter (Signed)
I called and left pt a vm to call the office her thyroid is overproductive Ms.Moore wanted to know when was the last time she seen her endocrinologist. Marijo Sanes

## 2020-05-02 LAB — HM COLONOSCOPY

## 2020-05-05 ENCOUNTER — Encounter: Payer: Self-pay | Admitting: Nurse Practitioner

## 2020-06-11 IMAGING — MG DIGITAL DIAGNOSTIC UNILATERAL RIGHT MAMMOGRAM WITH TOMO AND CAD
6 series · 6 of 18 positions shown · non-contrast
Comparison: Previous exam(s).

CLINICAL DATA: Possible asymmetry in the superior aspect of the
right breast in the oblique projection of a recent screening
mammogram.

EXAM:
DIGITAL DIAGNOSTIC UNILATERAL RIGHT MAMMOGRAM WITH CAD AND TOMO

[R MLO synth-2D]
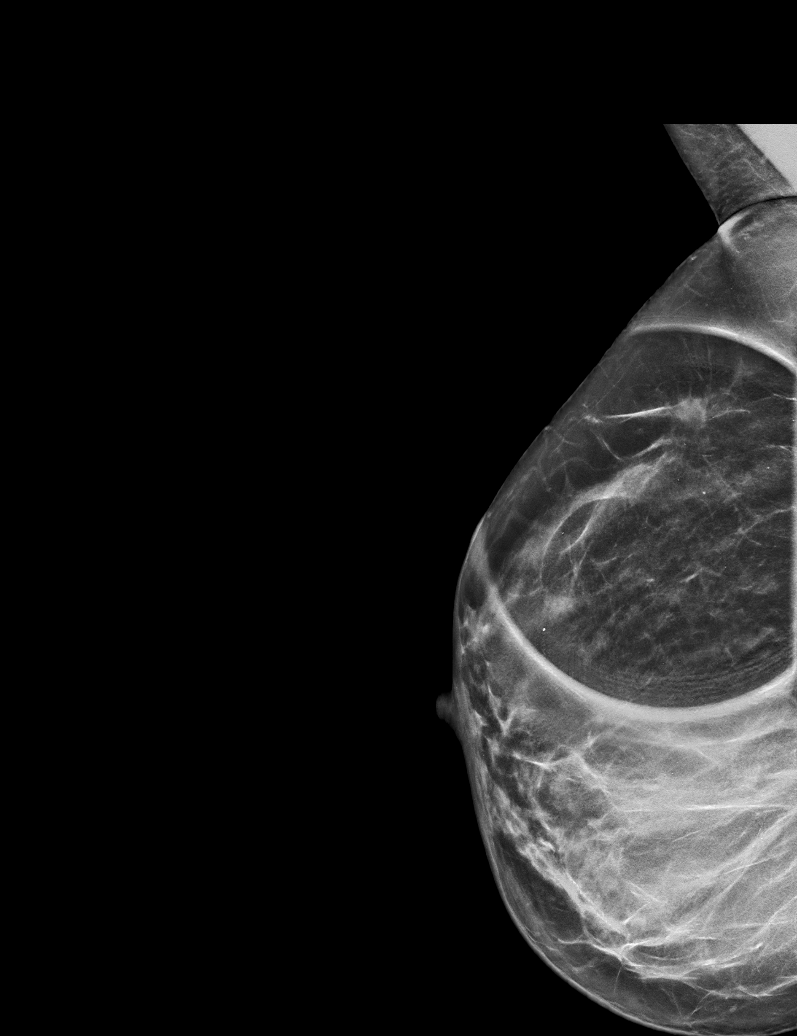

[R XCCL synth-2D]
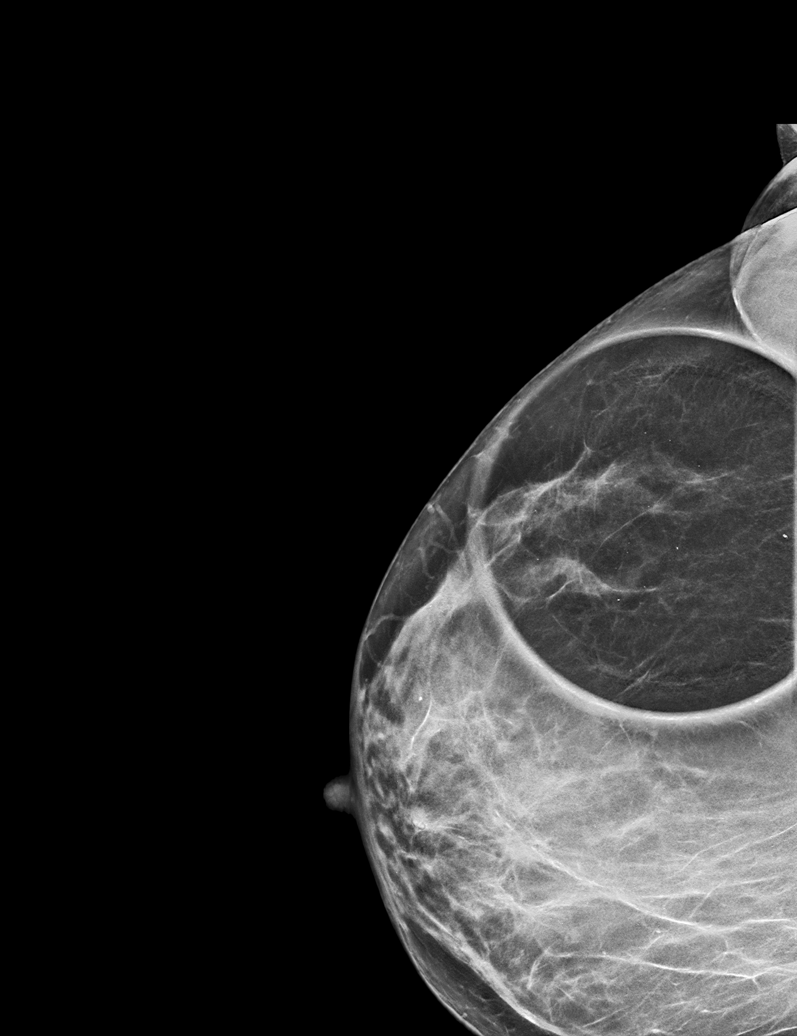

[R ML synth-2D]
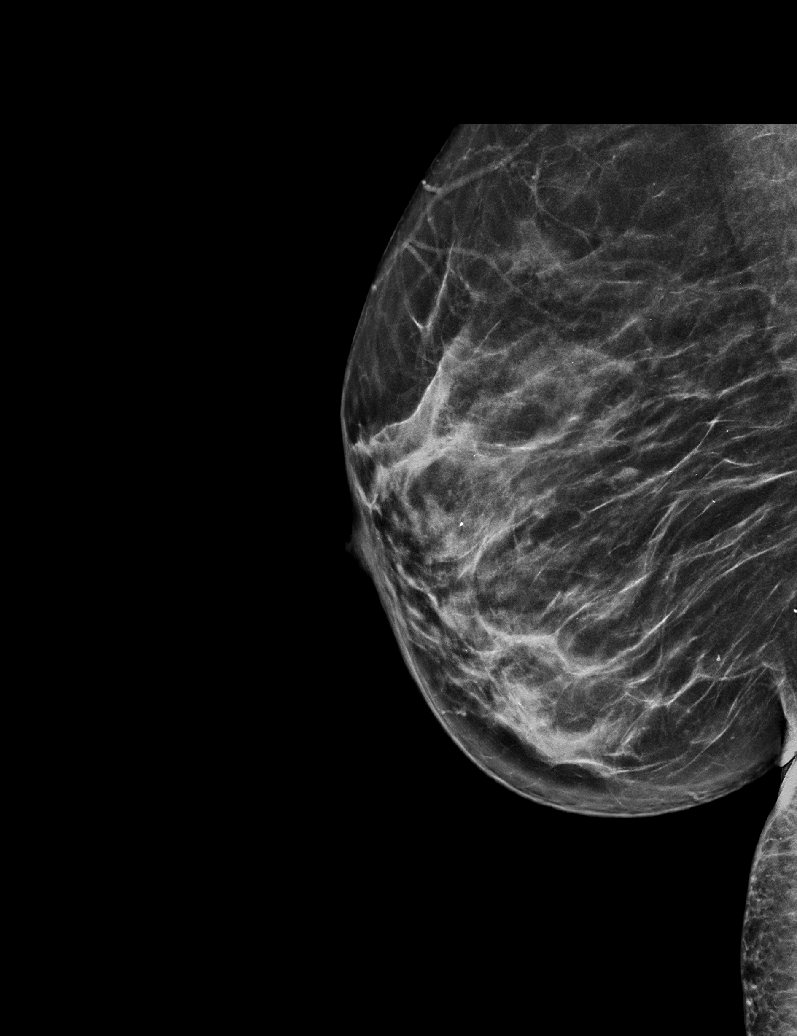

[R XCCL tomo · tomo slice 27/54.0]
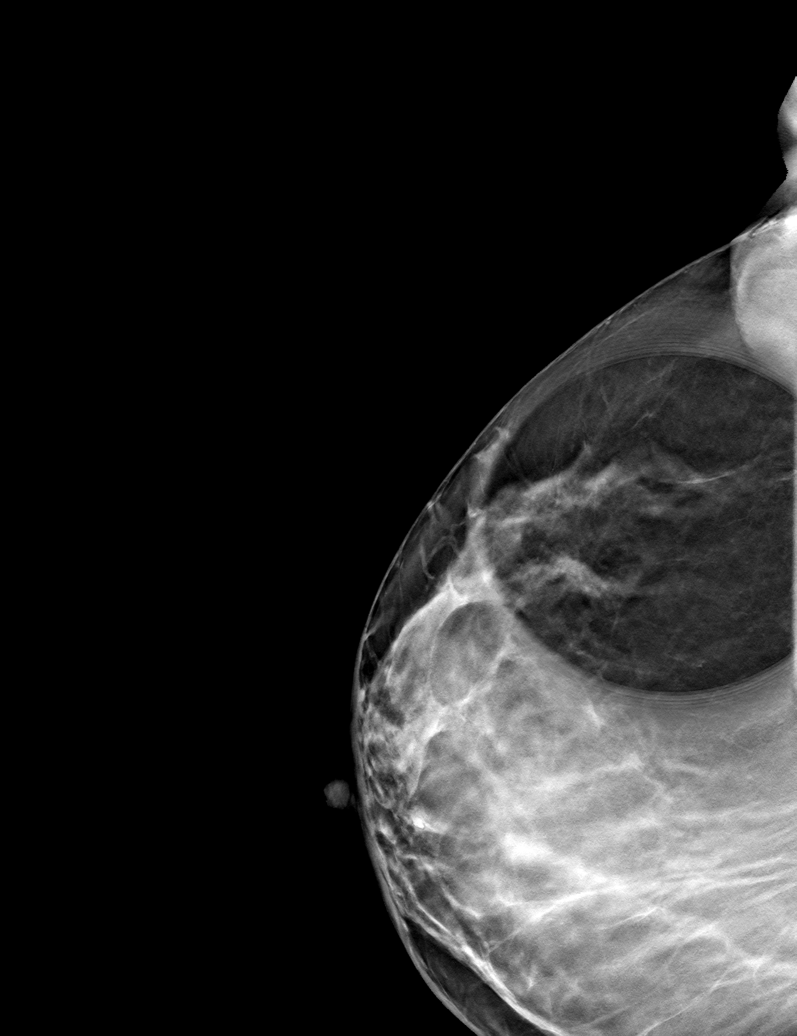

[R ML tomo · tomo slice 32/63.0]
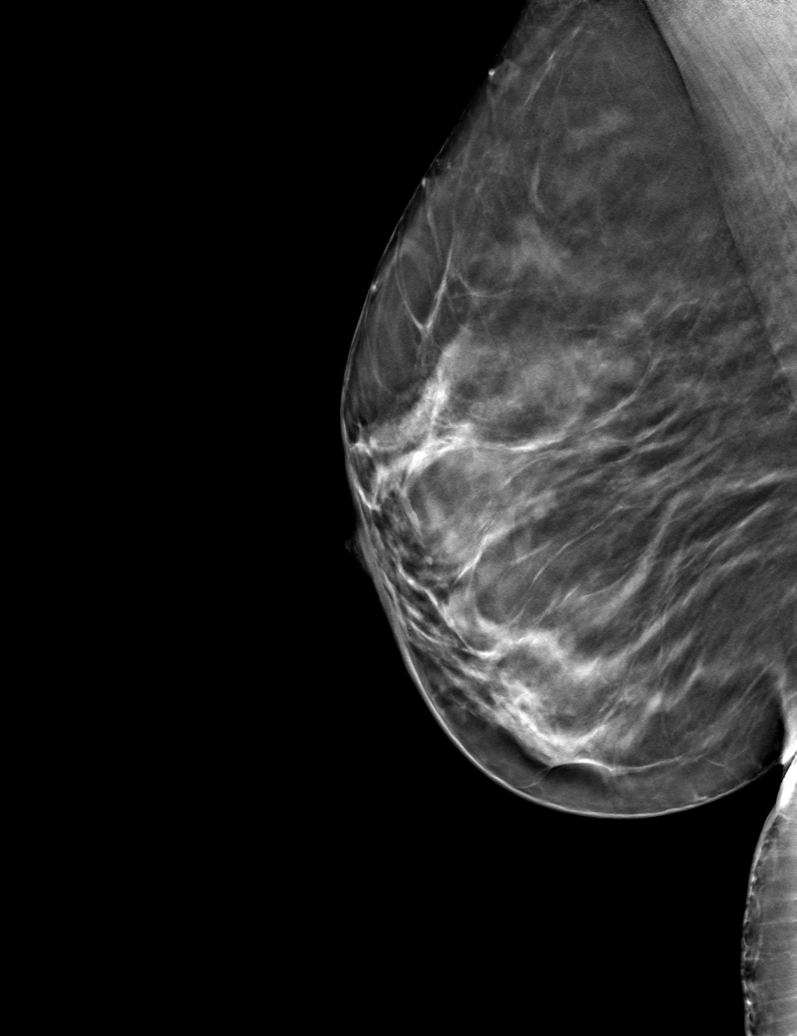

[R MLO tomo · tomo slice 27/52.0]
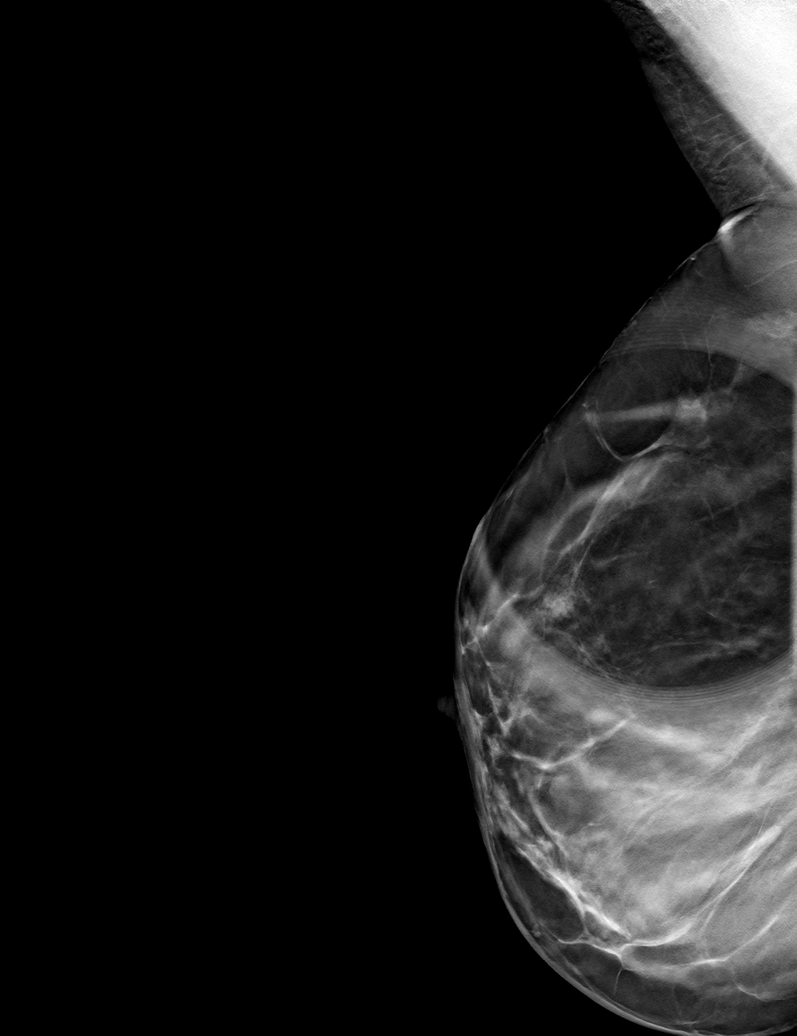

[6 of 18 positions shown; findings below may reference images not displayed]

ACR Breast Density Category c: The breast tissue is heterogeneously
dense, which may obscure small masses.
FINDINGS: 3D tomographic and 2D generated true lateral and spot compression
views of the right breast were obtained. These demonstrate normal
appearing fibroglandular tissue at the location of the recently
suspected asymmetry, unchanged compared previous examinations.

Mammographic images were processed with CAD.
IMPRESSION: No evidence of malignancy. The recently suspected right breast
asymmetry was close apposition of normal breast tissue.

RECOMMENDATION:
Bilateral screening mammogram in 1 year.

I have discussed the findings and recommendations with the patient.
Results were also provided in writing at the conclusion of the
visit. If applicable, a reminder letter will be sent to the patient
regarding the next appointment.

BI-RADS CATEGORY  1: Negative.

## 2020-08-05 ENCOUNTER — Ambulatory Visit: Admitting: Nurse Practitioner

## 2020-08-13 ENCOUNTER — Other Ambulatory Visit: Payer: Self-pay

## 2020-08-13 ENCOUNTER — Encounter: Payer: Self-pay | Admitting: Nurse Practitioner

## 2020-08-13 ENCOUNTER — Ambulatory Visit (INDEPENDENT_AMBULATORY_CARE_PROVIDER_SITE_OTHER): Payer: 59 | Admitting: Nurse Practitioner

## 2020-08-13 VITALS — BP 128/70 | HR 66 | Temp 98.9°F | Ht 61.6 in | Wt 146.2 lb

## 2020-08-13 DIAGNOSIS — Z6827 Body mass index (BMI) 27.0-27.9, adult: Secondary | ICD-10-CM | POA: Diagnosis not present

## 2020-08-13 DIAGNOSIS — F419 Anxiety disorder, unspecified: Secondary | ICD-10-CM

## 2020-08-13 DIAGNOSIS — E663 Overweight: Secondary | ICD-10-CM | POA: Diagnosis not present

## 2020-08-13 NOTE — Patient Instructions (Signed)

## 2020-08-13 NOTE — Progress Notes (Signed)
I,Tianna Badgett,acting as a Neurosurgeon for [employer], FNP.,have documented all relevant documentation on the behalf of Arnette Felts, FNP,as directed by  Arnette Felts, FNP while in the presence of Arnette Felts, FNP.  This visit occurred during the SARS-CoV-2 public health emergency.  Safety protocols were in place, including screening questions prior to the visit, additional usage of staff PPE, and extensive cleaning of exam room while observing appropriate contact time as indicated for disinfecting solutions.  Subjective:     Patient ID: [name] , female    DOB: 1975/09/25 , 45 y.o.   MRN: [id number]   Chief Complaint  Patient presents with   Anxiety    HPI  Patient is here for follow up for anxiety. She continues with the buspirone and took once during the day and felt "funny".  Overall she feels like she is doing well and able to manage.    She is taking a probiotic Dr. Loreta Ave recommended which has helped with her bowel movements. She also had a test for food sensitivities.   Anxiety Presents for follow-up visit. Patient reports no chest pain or palpitations. The severity of symptoms is mild. The quality of sleep is good.      Past Medical History:  Diagnosis Date   Abnormal Pap smear    Anemia    Anxiety    CHF (congestive heart failure) ([facility])    History of congestive heart failure    with pulm edema, hyperthyroid dx   Hyperthyroidism    treated with radioactive iodine now hypothyroid   Hypothyroidism    IBS (irritable bowel syndrome)    Sickle cell trait ([facility])      Family History  Problem Relation Age of Onset   Arthritis Mother    Hypertension Mother    Sickle cell trait Mother    Diabetes Father    Hypertension Father    Heart attack Father    Sickle cell trait Brother    Cancer Maternal Aunt        breast   Cancer Maternal Grandmother        breast   Cancer Paternal Grandmother      Current Outpatient Medications:    busPIRone (BUSPAR) 10 MG  tablet, Take 1 tablet (10 mg total) by mouth daily., Disp: 90 tablet, Rfl: 1   desogestrel-ethinyl estradiol (MIRCETTE) 0.15-0.02/0.01 MG (21/5) tablet, Take 1 tablet by mouth daily., Disp: , Rfl:    hydrOXYzine (VISTARIL) 25 MG capsule, Take 1 capsule (25 mg total) by mouth 3 (three) times daily as needed., Disp: 30 capsule, Rfl: 0   Levothyroxine Sodium 88 MCG CAPS, Take 88 mcg by mouth daily before breakfast. , Disp: , Rfl:    No Known Allergies   Review of Systems  Constitutional: Negative.   Respiratory: Negative.    Cardiovascular: Negative.  Negative for chest pain, palpitations and leg swelling.  Gastrointestinal: Negative.   Neurological: Negative.     Today's Vitals   08/13/20 1510  BP: 128/70  Pulse: 66  Temp: 98.9 F (37.2 C)  TempSrc: Oral  Weight: 146 lb 3.2 oz (66.3 kg)  Height: 5' 1.6" (1.565 m)   Body mass index is 27.09 kg/m.  Wt Readings from Last 3 Encounters:  08/13/20 146 lb 3.2 oz (66.3 kg)  02/06/20 144 lb 3.2 oz (65.4 kg)  04/11/18 135 lb (61.2 kg)    Objective:  Physical Exam Vitals reviewed.  Constitutional:      Appearance: Normal appearance.  Pulmonary:  Effort: Pulmonary effort is normal. No respiratory distress.     Breath sounds: Normal breath sounds. No wheezing.  Neurological:     General: No focal deficit present.     Mental Status: She is alert and oriented to person, place, and time.  Psychiatric:        Mood and Affect: Mood normal.        Behavior: Behavior normal.        Thought Content: Thought content normal.        Judgment: Judgment normal.        Assessment And Plan:     1. Anxiety Comments: Chronic, continue with current medications  2. Overweight with body mass index (BMI) of 27 to 27.9 in adult Comments: Continue with regular exercise and eating a healthy diet  She is encouraged to strive for BMI less than 30 to decrease cardiac risk. Advised to aim for at least 150 minutes of exercise per  week.    Patient was given opportunity to ask questions. Patient verbalized understanding of the plan and was able to repeat key elements of the plan. All questions were answered to their satisfaction.  Arnette Felts, FNP   I, Arnette Felts, FNP, have reviewed all documentation for this visit. The documentation on 08/13/20 for the exam, diagnosis, procedures, and orders are all accurate and complete.   IF YOU HAVE BEEN REFERRED TO A SPECIALIST, IT MAY TAKE 1-2 WEEKS TO SCHEDULE/PROCESS THE REFERRAL. IF YOU HAVE NOT HEARD FROM US/SPECIALIST IN TWO WEEKS, PLEASE GIVE [place] A CALL AT [phone] X [phone].   THE PATIENT IS ENCOURAGED TO PRACTICE SOCIAL DISTANCING DUE TO THE COVID-19 PANDEMIC.

## 2020-11-04 ENCOUNTER — Encounter: Payer: Self-pay | Admitting: Nurse Practitioner

## 2020-11-12 ENCOUNTER — Encounter: Payer: Self-pay | Admitting: Nurse Practitioner

## 2020-11-12 ENCOUNTER — Ambulatory Visit (INDEPENDENT_AMBULATORY_CARE_PROVIDER_SITE_OTHER): Payer: 59 | Admitting: Nurse Practitioner

## 2020-11-12 ENCOUNTER — Other Ambulatory Visit: Payer: Self-pay

## 2020-11-12 VITALS — BP 132/84 | HR 82 | Temp 98.3°F | Ht 61.6 in | Wt 145.2 lb

## 2020-11-12 DIAGNOSIS — Z23 Encounter for immunization: Secondary | ICD-10-CM

## 2020-11-12 DIAGNOSIS — R21 Rash and other nonspecific skin eruption: Secondary | ICD-10-CM

## 2020-11-12 MED ORDER — FLUCONAZOLE 100 MG PO TABS: 100.0000 mg | ORAL_TABLET | Freq: Every day | ORAL | 0 refills | Status: DC

## 2020-11-12 MED ORDER — MUPIROCIN CALCIUM 2 % EX CREA: TOPICAL_CREAM | CUTANEOUS | 0 refills | Status: AC

## 2020-11-12 NOTE — Progress Notes (Signed)
I,Yamilka J Llittleton,acting as a Neurosurgeon for [employer], FNP.,have documented all relevant documentation on the behalf of Arnette Felts, FNP,as directed by  Arnette Felts, FNP while in the presence of Arnette Felts, FNP.   This visit occurred during the SARS-CoV-2 public health emergency.  Safety protocols were in place, including screening questions prior to the visit, additional usage of staff PPE, and extensive cleaning of exam room while observing appropriate contact time as indicated for disinfecting solutions.  Subjective:     Patient ID: [name] , female    DOB: 06/15/1975 , 45 y.o.   MRN: [id number]   Chief Complaint  Patient presents with   Rash    HPI  Patient presents today for a rash. Lotrimin spray helps. She has severe itching when this occurs, does have pain when she scratches.   Wt Readings from Last 3 Encounters: 11/12/20 : 145 lb 3.2 oz (65.9 kg) 08/13/20 : 146 lb 3.2 oz (66.3 kg) 02/06/20 : 144 lb 3.2 oz (65.4 kg)    Rash This is a recurrent problem. The current episode started more than 1 year ago. The problem has been gradually worsening since onset. The affected locations include the right hip, left lower leg, left arm, right arm and left upper leg. The rash is characterized by redness and itchiness. She was exposed to nothing. Past treatments include anti-itch cream (lotrimin spray). The treatment provided mild relief.    Past Medical History:  Diagnosis Date   Abnormal Pap smear    Anemia    Anxiety    CHF (congestive heart failure) (HCC)    History of congestive heart failure    with pulm edema, hyperthyroid dx   Hyperthyroidism    treated with radioactive iodine now hypothyroid   Hypothyroidism    IBS (irritable bowel syndrome)    Sickle cell trait (HCC)      Family History  Problem Relation Age of Onset   Arthritis Mother    Hypertension Mother    Sickle cell trait Mother    Diabetes Father    Hypertension Father    Heart attack  Father    Sickle cell trait Brother    Cancer Maternal Aunt        breast   Cancer Maternal Grandmother        breast   Cancer Paternal Grandmother      Current Outpatient Medications:    busPIRone (BUSPAR) 10 MG tablet, Take 1 tablet (10 mg total) by mouth daily., Disp: 90 tablet, Rfl: 1   desogestrel-ethinyl estradiol (MIRCETTE) 0.15-0.02/0.01 MG (21/5) tablet, Take 1 tablet by mouth daily., Disp: , Rfl:    fluconazole (DIFLUCAN) 100 MG tablet, Take 1 tablet (100 mg total) by mouth daily. Take 1 tablet by mouth now repeat in 5 days, Disp: 2 tablet, Rfl: 0   hydrOXYzine (VISTARIL) 25 MG capsule, Take 1 capsule (25 mg total) by mouth 3 (three) times daily as needed., Disp: 30 capsule, Rfl: 0   Levothyroxine Sodium 88 MCG CAPS, Take 88 mcg by mouth daily before breakfast. , Disp: , Rfl:    mupirocin cream (BACTROBAN) 2 %, Apply to affected area 3 times daily, Disp: 30 g, Rfl: 0   No Known Allergies   Review of Systems  Constitutional: Negative.   Respiratory: Negative.    Skin:  Positive for rash.  Neurological:  Negative for dizziness and headaches.  Psychiatric/Behavioral: Negative.      Today's Vitals   11/12/20 1128  BP: 132/84  Pulse: 82  Temp: 98.3 F (36.8 C)  Weight: 145 lb 3.2 oz (65.9 kg)  Height: 5' 1.6" (1.565 m)  PainSc: 0-No pain   Body mass index is 26.9 kg/m.   Objective:  Physical Exam Vitals reviewed.  Constitutional:      General: She is not in acute distress.    Appearance: Normal appearance. She is not ill-appearing.  Pulmonary:     Effort: Pulmonary effort is normal. No respiratory distress.     Breath sounds: Normal breath sounds. No wheezing.  Skin:    General: Skin is warm and dry.     Findings: Rash (to bilataral arms also has a hyperpigmented area to some areas) present.  Neurological:     General: No focal deficit present.     Mental Status: She is alert and oriented to person, place, and time.     Cranial Nerves: No cranial nerve  deficit.     Motor: No weakness.  Psychiatric:        Mood and Affect: Mood normal.        Behavior: Behavior normal.        Thought Content: Thought content normal.        Judgment: Judgment normal.        Assessment And Plan:     1. Rash and nonspecific skin eruption Comments: This rash has been ongoing, concerned may be a staph infection to skin, will treat with bactroban if not better will consider referral to Dermatology - mupirocin cream (BACTROBAN) 2 %; Apply to affected area 3 times daily  Dispense: 30 g; Refill: 0 - HSV(herpes simplex vrs) 1+2 ab-IgG - fluconazole (DIFLUCAN) 100 MG tablet; Take 1 tablet (100 mg total) by mouth daily. Take 1 tablet by mouth now repeat in 5 days  Dispense: 2 tablet; Refill: 0  2. Immunization due Influenza vaccine administered Encouraged to take Tylenol as needed for fever or muscle aches. - Flu Vaccine QUAD 6+ mos PF IM (Fluarix Quad PF)     Patient was given opportunity to ask questions. Patient verbalized understanding of the plan and was able to repeat key elements of the plan. All questions were answered to their satisfaction.  Arnette Felts, FNP   I, Arnette Felts, FNP, have reviewed all documentation for this visit. The documentation on 11/12/20 for the exam, diagnosis, procedures, and orders are all accurate and complete.   IF YOU HAVE BEEN REFERRED TO A SPECIALIST, IT MAY TAKE 1-2 WEEKS TO SCHEDULE/PROCESS THE REFERRAL. IF YOU HAVE NOT HEARD FROM US/SPECIALIST IN TWO WEEKS, PLEASE GIVE [place] A CALL AT [phone].   THE PATIENT IS ENCOURAGED TO PRACTICE SOCIAL DISTANCING DUE TO THE COVID-19 PANDEMIC.

## 2020-11-13 ENCOUNTER — Encounter: Payer: Self-pay | Admitting: Nurse Practitioner

## 2020-11-13 LAB — HSV(HERPES SIMPLEX VRS) I + II AB-IGG
HSV 1 Glycoprotein G Ab, IgG: 3.79 index — ABNORMAL HIGH (ref 0.00–0.90)
HSV 2 IgG, Type Spec: <0.91 index (ref 0.00–0.90)

## 2020-11-20 ENCOUNTER — Encounter: Payer: Self-pay | Admitting: Nurse Practitioner

## 2020-11-27 LAB — HM PAP SMEAR

## 2020-12-02 ENCOUNTER — Encounter: Payer: Self-pay | Admitting: Nurse Practitioner

## 2021-02-05 ENCOUNTER — Encounter: Admitting: Nurse Practitioner

## 2021-02-10 ENCOUNTER — Encounter: Admitting: Nurse Practitioner

## 2021-02-11 ENCOUNTER — Ambulatory Visit (INDEPENDENT_AMBULATORY_CARE_PROVIDER_SITE_OTHER): Payer: 59 | Admitting: Nurse Practitioner

## 2021-02-11 ENCOUNTER — Encounter: Payer: Self-pay | Admitting: Nurse Practitioner

## 2021-02-11 ENCOUNTER — Other Ambulatory Visit: Payer: Self-pay

## 2021-02-11 VITALS — BP 128/72 | HR 106 | Temp 98.1°F | Ht 61.6 in | Wt 145.0 lb

## 2021-02-11 DIAGNOSIS — R21 Rash and other nonspecific skin eruption: Secondary | ICD-10-CM

## 2021-02-11 DIAGNOSIS — H00021 Hordeolum internum right upper eyelid: Secondary | ICD-10-CM | POA: Diagnosis not present

## 2021-02-11 MED ORDER — ERYTHROMYCIN 5 MG/GM OP OINT: 1.0000 "application " | TOPICAL_OINTMENT | Freq: Every day | OPHTHALMIC | 0 refills | Status: DC

## 2021-02-11 NOTE — Progress Notes (Signed)
I,Tianna Badgett,acting as a Neurosurgeon for [employer], FNP.,have documented all relevant documentation on the behalf of Arnette Felts, FNP,as directed by  Arnette Felts, FNP while in the presence of Arnette Felts, FNP.  This visit occurred during the SARS-CoV-2 public health emergency.  Safety protocols were in place, including screening questions prior to the visit, additional usage of staff PPE, and extensive cleaning of exam room while observing appropriate contact time as indicated for disinfecting solutions.  Subjective:     Patient ID: [name] , female    DOB: 03/22/1975 , 46 y.o.   MRN: [id number]   Chief Complaint  Patient presents with   Eye Problem    HPI  Patient is having problems with recurring styes on her eye. The first time she had the stye was in upper eyelid - January 2023 used a warm compress then couple weeks later had swollen eye was painful could see under her eyelid, stopped hurting after 3 days. Now back to being swollen but does not have pain. She does have an eye doctor at [facility]. Will have crusting.   Eye Problem  The right eye is affected. This is a recurrent problem. There was no injury mechanism. Associated symptoms include blurred vision. Pertinent negatives include no double vision or nausea.    Past Medical History:  Diagnosis Date   Abnormal Pap smear    Anemia    Anxiety    CHF (congestive heart failure) (HCC)    History of congestive heart failure    with pulm edema, hyperthyroid dx   Hyperthyroidism    treated with radioactive iodine now hypothyroid   Hypothyroidism    IBS (irritable bowel syndrome)    Sickle cell trait (HCC)      Family History  Problem Relation Age of Onset   Arthritis Mother    Hypertension Mother    Sickle cell trait Mother    Diabetes Father    Hypertension Father    Heart attack Father    Sickle cell trait Brother    Cancer Maternal Aunt        breast   Cancer Maternal Grandmother         breast   Cancer Paternal Grandmother      Current Outpatient Medications:    busPIRone (BUSPAR) 10 MG tablet, Take 1 tablet (10 mg total) by mouth daily., Disp: 90 tablet, Rfl: 1   desogestrel-ethinyl estradiol (MIRCETTE) 0.15-0.02/0.01 MG (21/5) tablet, Take 1 tablet by mouth daily., Disp: , Rfl:    erythromycin ophthalmic ointment, Place 1 application into the right eye at bedtime., Disp: 3.5 g, Rfl: 0   hydrOXYzine (VISTARIL) 25 MG capsule, Take 1 capsule (25 mg total) by mouth 3 (three) times daily as needed., Disp: 30 capsule, Rfl: 0   Levothyroxine Sodium 88 MCG CAPS, Take 88 mcg by mouth daily before breakfast. , Disp: , Rfl:    mupirocin cream (BACTROBAN) 2 %, Apply to affected area 3 times daily, Disp: 30 g, Rfl: 0   No Known Allergies   Review of Systems  Constitutional: Negative.   Eyes:  Positive for blurred vision. Negative for double vision.  Respiratory: Negative.    Cardiovascular: Negative.   Gastrointestinal: Negative.  Negative for nausea.  Skin:  Positive for rash (bilateral arms and small area on left hip that improved with the steroid cream).  Neurological: Negative.     Today's Vitals   02/11/21 1207  BP: 128/72  Pulse: (!) 106  Temp: 98.1  F (36.7 C)  TempSrc: Oral  Weight: 145 lb (65.8 kg)  Height: 5' 1.6" (1.565 m)   Body mass index is 26.87 kg/m.  Wt Readings from Last 3 Encounters:  02/11/21 145 lb (65.8 kg)  11/12/20 145 lb 3.2 oz (65.9 kg)  08/13/20 146 lb 3.2 oz (66.3 kg)    Objective:  Physical Exam Vitals reviewed.  Constitutional:      General: She is not in acute distress.    Appearance: Normal appearance. She is not ill-appearing.  Eyes:     General:        Right eye: Hordeolum present.        Left eye: No hordeolum.  Pulmonary:     Effort: Pulmonary effort is normal. No respiratory distress.     Breath sounds: Normal breath sounds. No wheezing.  Skin:    General: Skin is warm and dry.     Findings: Rash present.   Neurological:     General: No focal deficit present.     Mental Status: She is alert and oriented to person, place, and time.     Cranial Nerves: No cranial nerve deficit.     Motor: No weakness.  Psychiatric:        Mood and Affect: Mood normal.        Behavior: Behavior normal.        Thought Content: Thought content normal.        Judgment: Judgment normal.        Assessment And Plan:     1. Hordeolum internum of right upper eyelid Comments: Will treat with erythromycin ointment if not better call eye doctor.  - erythromycin ophthalmic ointment; Place 1 application into the right eye at bedtime.  Dispense: 3.5 g; Refill: 0  2. Rash and nonspecific skin eruption Comments: persistent rash to arms and left side would like evaluated by dermatology - Ambulatory referral to Dermatology     Patient was given opportunity to ask questions. Patient verbalized understanding of the plan and was able to repeat key elements of the plan. All questions were answered to their satisfaction.  Arnette Felts, FNP   I, Arnette Felts, FNP, have reviewed all documentation for this visit. The documentation on 02/11/21 for the exam, diagnosis, procedures, and orders are all accurate and complete.   IF YOU HAVE BEEN REFERRED TO A SPECIALIST, IT MAY TAKE 1-2 WEEKS TO SCHEDULE/PROCESS THE REFERRAL. IF YOU HAVE NOT HEARD FROM US/SPECIALIST IN TWO WEEKS, PLEASE GIVE [place] A CALL AT [phone].   THE PATIENT IS ENCOURAGED TO PRACTICE SOCIAL DISTANCING DUE TO THE COVID-19 PANDEMIC.

## 2021-02-11 NOTE — Patient Instructions (Signed)
Stye A stye, also known as a hordeolum, is a bump that forms on an eyelid. It may look like a pimple next to the eyelash. A stye can form inside the eyelid (internal stye) or outside the eyelid (external stye). A stye can cause redness, swelling, and pain on the eyelid. Styes are very common. Anyone can get them at any age. They usually occur in just one eye at a time, but you may have more than one in either eye. What are the causes? A stye is caused by an infection. The infection is almost always caused by bacteria called Staphylococcus aureus. This is a common type of bacteria that lives on the skin. An internal stye may result from an infected oil-producing gland inside the eyelid. An external stye may be caused by an infection at the base of the eyelash (hair follicle). What increases the risk? You are more likely to develop a stye if: You have had a stye before. You have any of these conditions: Red, itchy, inflamed eyelids (blepharitis). A skin condition such as seborrheic dermatitis or rosacea. High fat levels in your blood (lipids). Dry eyes. What are the signs or symptoms? The most common symptom of a stye is eyelid pain. Internal styes are more painful than external styes. Other symptoms may include: Painful swelling of your eyelid. A scratchy feeling in your eye. Tearing and redness of your eye. A pimple-like bump on the edge of the eyelid. Pus draining from the stye. How is this diagnosed? Your health care provider may be able to diagnose a stye just by examining your eye. The health care provider may also check to make sure: You do not have a fever or other signs of a more serious infection. The infection has not spread to other parts of your eye or areas around your eye. How is this treated? Most styes will clear up in a few days without treatment or with warm compresses applied to the area. You may need to use antibiotic drops or ointment to treat an infection. Sometimes,  steroid drops or ointment are used in addition to antibiotics. In some cases, your health care provider may give you a small steroid injection in the eyelid. If your stye does not heal with routine treatment, your health care provider may drain pus from the stye using a thin blade or needle. This may be done if the stye is large, causing a lot of pain, or affecting your vision. Follow these instructions at home: Take over-the-counter and prescription medicines only as told by your health care provider. This includes eye drops or ointments. If you were prescribed an antibiotic medicine, steroid medicine, or both, apply or use them as told by your health care provider. Do not stop using the medicine even if your condition improves. Apply a warm, wet cloth (warm compress) to your eye for 5-10 minutes, 4 to 6 times a day. Clean the affected eyelid as directed by your health care provider. Do not wear contact lenses or eye makeup until your stye has healed and your health care provider says that it is safe. Do not try to pop or drain the stye. Do not rub your eye. Contact a health care provider if: You have chills or a fever. Your stye does not go away after several days. Your stye affects your vision. Your eyeball becomes swollen, red, or painful. Get help right away if: You have pain when moving your eye around. Summary A stye is a bump that forms   on an eyelid. It may look like a pimple next to the eyelash. A stye can form inside the eyelid (internal stye) or outside the eyelid (external stye). A stye can cause redness, swelling, and pain on the eyelid. Your health care provider may be able to diagnose a stye just by examining your eye. Apply a warm, wet cloth (warm compress) to your eye for 5-10 minutes, 4 to 6 times a day. This information is not intended to replace advice given to you by your health care provider. Make sure you discuss any questions you have with your health care  provider. Document Revised: 02/27/2020 Document Reviewed: 02/27/2020 Elsevier Patient Education  2022 Elsevier Inc.  

## 2021-06-10 ENCOUNTER — Encounter: Payer: Self-pay | Admitting: Nurse Practitioner

## 2021-06-10 ENCOUNTER — Ambulatory Visit (INDEPENDENT_AMBULATORY_CARE_PROVIDER_SITE_OTHER): Payer: 59 | Admitting: Nurse Practitioner

## 2021-06-10 VITALS — BP 124/70 | HR 71 | Temp 98.7°F | Ht 61.6 in | Wt 144.0 lb

## 2021-06-10 DIAGNOSIS — Z79899 Other long term (current) drug therapy: Secondary | ICD-10-CM

## 2021-06-10 DIAGNOSIS — R809 Proteinuria, unspecified: Secondary | ICD-10-CM

## 2021-06-10 DIAGNOSIS — Z0000 Encounter for general adult medical examination without abnormal findings: Secondary | ICD-10-CM | POA: Diagnosis not present

## 2021-06-10 DIAGNOSIS — R319 Hematuria, unspecified: Secondary | ICD-10-CM

## 2021-06-10 DIAGNOSIS — N390 Urinary tract infection, site not specified: Secondary | ICD-10-CM

## 2021-06-10 DIAGNOSIS — E039 Hypothyroidism, unspecified: Secondary | ICD-10-CM

## 2021-06-10 DIAGNOSIS — F419 Anxiety disorder, unspecified: Secondary | ICD-10-CM | POA: Diagnosis not present

## 2021-06-10 DIAGNOSIS — R8290 Unspecified abnormal findings in urine: Secondary | ICD-10-CM

## 2021-06-10 LAB — POCT URINALYSIS DIPSTICK
Bilirubin, UA: NEGATIVE
Glucose, UA: NEGATIVE
Ketones, UA: NEGATIVE
Leukocytes, UA: NEGATIVE
Nitrite, UA: POSITIVE
Protein, UA: POSITIVE — AB
Spec Grav, UA: 1.02 (ref 1.010–1.025)
Urobilinogen, UA: 0.2 E.U./dL
pH, UA: 5.5 (ref 5.0–8.0)

## 2021-06-10 MED ORDER — SULFAMETHOXAZOLE-TRIMETHOPRIM 800-160 MG PO TABS: 1.0000 | ORAL_TABLET | Freq: Two times a day (BID) | ORAL | 0 refills | Status: DC

## 2021-06-10 MED ORDER — BUSPIRONE HCL 10 MG PO TABS: 10.0000 mg | ORAL_TABLET | Freq: Every day | ORAL | 1 refills | Status: DC

## 2021-06-10 NOTE — Patient Instructions (Signed)

## 2021-06-10 NOTE — Progress Notes (Signed)
I,Tianna Badgett,acting as a Education administrator for [employer], FNP.,have documented all relevant documentation on the behalf of Minette Brine, FNP,as directed by  Minette Brine, FNP while in the presence of Minette Brine, [place].  This visit occurred during the SARS-CoV-2 public health emergency.  Safety protocols were in place, including screening questions prior to the visit, additional usage of staff PPE, and extensive cleaning of exam room while observing appropriate contact time as indicated for disinfecting solutions.  Subjective:     Patient ID: [name] , female    DOB: June 16, 1975 , 46 y.o.   MRN: [id number]   Chief Complaint  Patient presents with   Annual Exam    HPI  Pt is here today for her physical. She goes to [facility]. Continues to be followed by Dr. Chalmers Cater for her hypothyroid. She has been to the dermatologist who gave her a cream but did not know what it was but has not had any further episodes of a rash.     Past Medical History:  Diagnosis Date   Abnormal Pap smear    Anemia    Anxiety    CHF (congestive heart failure) (HCC)    History of congestive heart failure    with pulm edema, hyperthyroid dx   Hyperthyroidism    treated with radioactive iodine now hypothyroid   Hypothyroidism    IBS (irritable bowel syndrome)    Sickle cell trait ([place])      Family History  Problem Relation Age of Onset   Arthritis Mother    Hypertension Mother    Sickle cell trait Mother    Diabetes Father    Hypertension Father    Heart attack Father    Sickle cell trait Brother    Cancer Maternal Aunt        breast   Cancer Maternal Grandmother        breast   Cancer Paternal Grandmother      Current Outpatient Medications:    desogestrel-ethinyl estradiol (MIRCETTE) 0.15-0.02/0.01 MG (21/5) tablet, Take 1 tablet by mouth daily., Disp: , Rfl:    hydrOXYzine (VISTARIL) 25 MG capsule, Take 1 capsule (25 mg total) by mouth 3 (three) times daily as needed., Disp: 30  capsule, Rfl: 0   Levothyroxine Sodium 88 MCG CAPS, Take 88 mcg by mouth daily before breakfast. , Disp: , Rfl:    mupirocin cream (BACTROBAN) 2 %, Apply to affected area 3 times daily, Disp: 30 g, Rfl: 0   sulfamethoxazole-trimethoprim (BACTRIM DS) 800-160 MG tablet, Take 1 tablet by mouth 2 (two) times daily., Disp: 10 tablet, Rfl: 0   busPIRone (BUSPAR) 10 MG tablet, Take 1 tablet (10 mg total) by mouth daily., Disp: 90 tablet, Rfl: 1   No Known Allergies    The patient states she uses OCP (estrogen/progesterone) for birth control.  Patient's last menstrual period was 05/26/2021 (approximate).   Negative for Dysmenorrhea and Negative for Menorrhagia. Negative for: breast discharge, breast lump(s), breast pain and breast self exam. Associated symptoms include abnormal vaginal bleeding. Pertinent negatives include abnormal bleeding (hematology), anxiety, decreased libido, depression, difficulty falling sleep, dyspareunia, history of infertility, nocturia, sexual dysfunction, sleep disturbances, urinary incontinence, urinary urgency, vaginal discharge and vaginal itching. Diet regular; does admit to drinking more tea and soda and not enough water.  The patient states her exercise level is none.   The patient's tobacco use is:  Social History   Tobacco Use  Smoking Status Never  Smokeless Tobacco Never   She has  been exposed to passive smoke. The patient's alcohol use is:  Social History   Substance and Sexual Activity  Alcohol Use No   Additional information: Last pap 11/27/2020, she had a uterine polyp, next one scheduled for 11/28/2023.    Review of Systems  Constitutional: Negative.   HENT: Negative.    Eyes: Negative.   Respiratory: Negative.    Cardiovascular: Negative.   Gastrointestinal: Negative.   Endocrine: Negative.   Genitourinary: Negative.   Musculoskeletal: Negative.   Skin: Negative.   Allergic/Immunologic: Negative.   Neurological: Negative.   Hematological:  Negative.   Psychiatric/Behavioral: Negative.      Today's Vitals   06/10/21 1417  BP: 124/70  Pulse: 71  Temp: 98.7 F (37.1 C)  TempSrc: Oral  Weight: 144 lb (65.3 kg)  Height: 5' 1.6" (1.565 m)   Body mass index is 26.68 kg/m.  Wt Readings from Last 3 Encounters:  06/10/21 144 lb (65.3 kg)  02/11/21 145 lb (65.8 kg)  11/12/20 145 lb 3.2 oz (65.9 kg)    Objective:  Physical Exam Vitals reviewed.  Constitutional:      General: She is not in acute distress.    Appearance: Normal appearance. She is well-developed.  HENT:     Head: Normocephalic and atraumatic.     Right Ear: Hearing, tympanic membrane, ear canal and external ear normal. There is no impacted cerumen.     Left Ear: Hearing, tympanic membrane, ear canal and external ear normal. There is no impacted cerumen.     Nose: Nose normal.     Mouth/Throat:     Mouth: Mucous membranes are moist.  Eyes:     General: Lids are normal.     Extraocular Movements: Extraocular movements intact.     Conjunctiva/sclera: Conjunctivae normal.     Pupils: Pupils are equal, round, and reactive to light.     Funduscopic exam:    Right eye: No papilledema.        Left eye: No papilledema.  Neck:     Thyroid: No thyroid mass.     Vascular: No carotid bruit.  Cardiovascular:     Rate and Rhythm: Normal rate and regular rhythm.     Pulses: Normal pulses.     Heart sounds: Normal heart sounds. No murmur heard. Pulmonary:     Effort: Pulmonary effort is normal. No respiratory distress.     Breath sounds: Normal breath sounds. No wheezing.  Chest:     Chest wall: No mass.  Breasts:    Tanner Score is 5.     Right: Normal. No mass or tenderness.     Left: Normal. No mass or tenderness.  Abdominal:     General: Abdomen is flat. Bowel sounds are normal. There is no distension.     Palpations: Abdomen is soft.     Tenderness: There is no abdominal tenderness.  Genitourinary:    Comments: Deferred - followed by  GYN Musculoskeletal:        General: No swelling or tenderness. Normal range of motion.     Cervical back: Full passive range of motion without pain, normal range of motion and neck supple.     Right lower leg: No edema.     Left lower leg: No edema.  Lymphadenopathy:     Upper Body:     Right upper body: No supraclavicular, axillary or pectoral adenopathy.     Left upper body: No supraclavicular, axillary or pectoral adenopathy.  Skin:    General:  Skin is warm and dry.     Capillary Refill: Capillary refill takes less than 2 seconds.  Neurological:     General: No focal deficit present.     Mental Status: She is alert and oriented to person, place, and time.     Cranial Nerves: No cranial nerve deficit.     Sensory: No sensory deficit.  Psychiatric:        Mood and Affect: Mood normal.        Behavior: Behavior normal.        Thought Content: Thought content normal.        Judgment: Judgment normal.        Assessment And Plan:     1. Encounter for general adult medical examination w/o abnormal findings Behavior modifications discussed and diet history reviewed.   Pt will continue to exercise regularly and modify diet with low GI, plant based foods and decrease intake of processed foods.  Recommend intake of daily multivitamin, Vitamin D, and calcium.  Recommend mammogram and colonoscopy (up to date) for preventive screenings, as well as recommend immunizations that include influenza, TDAP (up to date) - CMP14+EGFR - Lipid panel  2. Acquired hypothyroidism Comments: Continue follow up with Dr. Chalmers Cater, will check thyroid levels and send copy to Dr. Chalmers Cater - TSH - T4 - T3, free  3. Anxiety Comments: Has not been as consistent but has needed a few times which has been effective.  - busPIRone (BUSPAR) 10 MG tablet; Take 1 tablet (10 mg total) by mouth daily.  Dispense: 90 tablet; Refill: 1  4. Cloudy urine - POCT urinalysis dipstick  5. Other long term (current) drug  therapy - CBC     Patient was given opportunity to ask questions. Patient verbalized understanding of the plan and was able to repeat key elements of the plan. All questions were answered to their satisfaction.   Minette Brine, FNP   I, Minette Brine, FNP, have reviewed all documentation for this visit. The documentation on 06/10/21 for the exam, diagnosis, procedures, and orders are all accurate and complete.  THE PATIENT IS ENCOURAGED TO PRACTICE SOCIAL DISTANCING DUE TO THE COVID-19 PANDEMIC.

## 2021-06-11 LAB — CBC
Hematocrit: 39.1 % (ref 34.0–46.6)
Hemoglobin: 13.3 g/dL (ref 11.1–15.9)
MCH: 31.2 pg (ref 26.6–33.0)
MCHC: 34 g/dL (ref 31.5–35.7)
MCV: 92 fL (ref 79–97)
Platelets: 319 10*3/uL (ref 150–450)
RBC: 4.26 x10E6/uL (ref 3.77–5.28)
RDW: 11.7 % (ref 11.7–15.4)
WBC: 7.9 10*3/uL (ref 3.4–10.8)

## 2021-06-11 LAB — LIPID PANEL
Chol/HDL Ratio: 3.1 ratio (ref 0.0–4.4)
Cholesterol, Total: 204 mg/dL — ABNORMAL HIGH (ref 100–199)
HDL: 65 mg/dL (ref >39)
LDL Chol Calc (NIH): 111 mg/dL — ABNORMAL HIGH (ref 0–99)
Triglycerides: 164 mg/dL — ABNORMAL HIGH (ref 0–149)
VLDL Cholesterol Cal: 28 mg/dL (ref 5–40)

## 2021-06-11 LAB — CMP14+EGFR
ALT: 14 IU/L (ref 0–32)
AST: 16 IU/L (ref 0–40)
Albumin/Globulin Ratio: 1.6 (ref 1.2–2.2)
Albumin: 4.7 g/dL (ref 3.8–4.8)
Alkaline Phosphatase: 61 IU/L (ref 44–121)
BUN/Creatinine Ratio: 9 (ref 9–23)
BUN: 8 mg/dL (ref 6–24)
Bilirubin Total: 0.7 mg/dL (ref 0.0–1.2)
CO2: 24 mmol/L (ref 20–29)
Calcium: 9.5 mg/dL (ref 8.7–10.2)
Chloride: 103 mmol/L (ref 96–106)
Creatinine, Ser: 0.92 mg/dL (ref 0.57–1.00)
Globulin, Total: 3 g/dL (ref 1.5–4.5)
Glucose: 85 mg/dL (ref 70–99)
Potassium: 4.3 mmol/L (ref 3.5–5.2)
Sodium: 140 mmol/L (ref 134–144)
Total Protein: 7.7 g/dL (ref 6.0–8.5)
eGFR: 77 mL/min/{1.73_m2} (ref >59)

## 2021-06-11 LAB — TSH: TSH: 1.55 u[IU]/mL (ref 0.450–4.500)

## 2021-06-11 LAB — T3, FREE: T3, Free: 2.5 pg/mL (ref 2.0–4.4)

## 2021-06-11 LAB — T4: T4, Total: 10.7 ug/dL (ref 4.5–12.0)

## 2021-06-13 LAB — URINE CULTURE

## 2021-06-25 ENCOUNTER — Encounter: Payer: Self-pay | Admitting: Nurse Practitioner

## 2021-06-29 ENCOUNTER — Other Ambulatory Visit: Payer: Self-pay | Admitting: Nurse Practitioner

## 2021-06-29 MED ORDER — NITROFURANTOIN MONOHYD MACRO 100 MG PO CAPS: 100.0000 mg | ORAL_CAPSULE | Freq: Two times a day (BID) | ORAL | 0 refills | Status: AC

## 2021-07-15 ENCOUNTER — Other Ambulatory Visit (INDEPENDENT_AMBULATORY_CARE_PROVIDER_SITE_OTHER): Payer: 59

## 2021-07-15 DIAGNOSIS — R319 Hematuria, unspecified: Secondary | ICD-10-CM | POA: Diagnosis not present

## 2021-07-15 DIAGNOSIS — N390 Urinary tract infection, site not specified: Secondary | ICD-10-CM | POA: Diagnosis not present

## 2021-07-16 ENCOUNTER — Other Ambulatory Visit: Payer: 59

## 2021-07-16 LAB — POCT URINALYSIS DIPSTICK
Bilirubin, UA: NEGATIVE
Blood, UA: NEGATIVE
Glucose, UA: NEGATIVE
Ketones, UA: NEGATIVE
Leukocytes, UA: NEGATIVE
Nitrite, UA: NEGATIVE
Protein, UA: NEGATIVE
Spec Grav, UA: 1.015 (ref 1.010–1.025)
Urobilinogen, UA: 0.2 E.U./dL
pH, UA: 7 (ref 5.0–8.0)

## 2021-11-11 ENCOUNTER — Encounter: Payer: Self-pay | Admitting: Nurse Practitioner

## 2021-11-17 ENCOUNTER — Encounter: Payer: Self-pay | Admitting: Nurse Practitioner

## 2021-11-17 ENCOUNTER — Ambulatory Visit (INDEPENDENT_AMBULATORY_CARE_PROVIDER_SITE_OTHER): Payer: 59 | Admitting: Nurse Practitioner

## 2021-11-17 VITALS — BP 112/62 | HR 85 | Temp 98.1°F | Ht 61.6 in | Wt 142.0 lb

## 2021-11-17 DIAGNOSIS — E7800 Pure hypercholesterolemia, unspecified: Secondary | ICD-10-CM

## 2021-11-17 DIAGNOSIS — Z23 Encounter for immunization: Secondary | ICD-10-CM

## 2021-11-17 DIAGNOSIS — R8290 Unspecified abnormal findings in urine: Secondary | ICD-10-CM

## 2021-11-17 DIAGNOSIS — E039 Hypothyroidism, unspecified: Secondary | ICD-10-CM

## 2021-11-17 LAB — POCT URINALYSIS DIPSTICK
Bilirubin, UA: NEGATIVE
Blood, UA: NEGATIVE
Glucose, UA: NEGATIVE
Ketones, UA: NEGATIVE
Leukocytes, UA: NEGATIVE
Nitrite, UA: NEGATIVE
Protein, UA: NEGATIVE
Spec Grav, UA: 1.015 (ref 1.010–1.025)
Urobilinogen, UA: 0.2 E.U./dL
pH, UA: 5.5 (ref 5.0–8.0)

## 2021-11-17 NOTE — Progress Notes (Signed)
Subjective:     Patient ID: [name] , female    DOB: 1975/04/14 , 46 y.o.   MRN: [id number]   Chief Complaint  Patient presents with   Hyperlipidemia    HPI  Reports having odorous urine has been ongoing since July when she had a urinary tract infection.      Past Medical History:  Diagnosis Date   Abnormal Pap smear    Anemia    Anxiety    CHF (congestive heart failure) (HCC)    History of congestive heart failure    with pulm edema, hyperthyroid dx   Hyperthyroidism    treated with radioactive iodine now hypothyroid   Hypothyroidism    IBS (irritable bowel syndrome)    Sickle cell trait (HCC)      Family History  Problem Relation Age of Onset   Arthritis Mother    Hypertension Mother    Sickle cell trait Mother    Diabetes Father    Hypertension Father    Heart attack Father    Sickle cell trait Brother    Cancer Maternal Aunt        breast   Cancer Maternal Grandmother        breast   Cancer Paternal Grandmother      Current Outpatient Medications:    busPIRone (BUSPAR) 10 MG tablet, Take 1 tablet (10 mg total) by mouth daily., Disp: 90 tablet, Rfl: 1   desogestrel-ethinyl estradiol (MIRCETTE) 0.15-0.02/0.01 MG (21/5) tablet, Take 1 tablet by mouth daily., Disp: , Rfl:    hydrOXYzine (VISTARIL) 25 MG capsule, Take 1 capsule (25 mg total) by mouth 3 (three) times daily as needed., Disp: 30 capsule, Rfl: 0   Levothyroxine Sodium 88 MCG CAPS, Take 88 mcg by mouth daily before breakfast. , Disp: , Rfl:    metroNIDAZOLE (FLAGYL) 500 MG tablet, Take 1 tablet (500 mg total) by mouth 3 (three) times daily for 10 days., Disp: 30 tablet, Rfl: 0   sulfamethoxazole-trimethoprim (BACTRIM DS) 800-160 MG tablet, Take 1 tablet by mouth 2 (two) times daily., Disp: 10 tablet, Rfl: 0   No Known Allergies   Review of Systems  Constitutional: Negative.   Respiratory: Negative.    Cardiovascular: Negative.   Gastrointestinal: Negative.   Endocrine: Negative  for polydipsia, polyphagia and polyuria.  Genitourinary: Negative.  Negative for decreased urine volume and frequency.  Musculoskeletal: Negative.   Skin: Negative.   Neurological: Negative.   Psychiatric/Behavioral: Negative.       Today's Vitals   11/17/21 0948  BP: 112/62  Pulse: 85  Temp: 98.1 F (36.7 C)  TempSrc: Oral  Weight: 142 lb (64.4 kg)  Height: 5' 1.6" (1.565 m)   Body mass index is 26.31 kg/m.  Wt Readings from Last 3 Encounters:  11/17/21 142 lb (64.4 kg)  06/10/21 144 lb (65.3 kg)  02/11/21 145 lb (65.8 kg)    Objective:  Physical Exam Vitals reviewed.  Constitutional:      General: She is not in acute distress.    Appearance: Normal appearance. She is not ill-appearing.  Eyes:     General:        Right eye: Hordeolum present.        Left eye: No hordeolum.  Cardiovascular:     Rate and Rhythm: Normal rate and regular rhythm.     Pulses: Normal pulses.     Heart sounds: Normal heart sounds. No murmur heard. Pulmonary:     Effort: Pulmonary effort  is normal. No respiratory distress.     Breath sounds: Normal breath sounds. No wheezing.  Skin:    General: Skin is warm and dry.     Findings: No rash.  Neurological:     General: No focal deficit present.     Mental Status: She is alert and oriented to person, place, and time.     Cranial Nerves: No cranial nerve deficit.     Motor: No weakness.  Psychiatric:        Mood and Affect: Mood normal.        Behavior: Behavior normal.        Thought Content: Thought content normal.        Judgment: Judgment normal.         Assessment And Plan:     1. Acquired hypothyroidism Comments: Thyroid levels are stable, continue current medications  2. Malodorous urine Comments: Urinalysis is normal. Will send urine culture. I have also given her samples of probiotic for vaginal health. Will send to check for bacterial vaginosis. - POCT Urinalysis Dipstick (81002) - Urine cytology ancillary only -  Culture, Urine  3. Elevated cholesterol Comments: Continue focusing on healthy diet low in fat. - Lipid panel  4. Need for influenza vaccination Influenza vaccine administered Encouraged to take Tylenol as needed for fever or muscle aches. - Flu Vaccine QUAD 6+ mos PF IM (Fluarix Quad PF)  5. Need for Tdap vaccination Will give tetanus vaccine today while in office. Refer to order management. TDAP will be administered to adults 21-14 years old every 10 years. - Tdap vaccine greater than or equal to 7yo IM     Patient was given opportunity to ask questions. Patient verbalized understanding of the plan and was able to repeat key elements of the plan. All questions were answered to their satisfaction.  Arnette Felts, FNP   I, Arnette Felts, FNP, have reviewed all documentation for this visit. The documentation on 11/17/21 for the exam, diagnosis, procedures, and orders are all accurate and complete.   IF YOU HAVE BEEN REFERRED TO A SPECIALIST, IT MAY TAKE 1-2 WEEKS TO SCHEDULE/PROCESS THE REFERRAL. IF YOU HAVE NOT HEARD FROM US/SPECIALIST IN TWO WEEKS, PLEASE GIVE [place] A CALL AT [phone].   THE PATIENT IS ENCOURAGED TO PRACTICE SOCIAL DISTANCING DUE TO THE COVID-19 PANDEMIC.

## 2021-11-17 NOTE — Patient Instructions (Addendum)
Clean Catch Urine Collection The clean catch urine collection method is a way of collecting a urine sample for lab testing. Using a clean catch method reduces the chance that bacteria and other fluids from the genital area will be collected in the urine sample. Many tests may be performed on the urine sample to help you and your health care provider determine appropriate treatment for your condition. The collection method includes: Cleaning your genital area. Collecting midstream urine. It is important to catch the middle part of the urine flow. Securing the sample for lab testing. General tips If you are asked to collect a urine sample at home, make sure you: Use supplies and instructions that you received from the lab. Collect urine only in the germ-free (sterile) cup that you received from the lab. Do not let any toilet paper or stool (feces) get into the cup. Refrigerate the sample until you can return it to the lab. It is up to you to get the results of your test. Ask your health care provider, or the department that is doing the test, when your results will be ready. Supplies needed: You will need the following supplies: Soap and water. Cleansing wipes. Collection container. How to collect a clean catch urine sample  The clean catch collection method varies slightly for women, men, and infants. Females Wash your hands with soap and water for at least 20 seconds. Place the cleansing wipes and collection container within reach. Open the collection container and place the lid with the flat side down. Be careful not to touch the inside of the lid. Spread your legs open and separate the inner folds of skin at the entrance of the vagina (labia minora). Clean your genital area: Take the first cleansing wipe and wipe from front to back inside your labia. Throw the cleansing wipe away. Take the second cleansing wipe and clean around the middle area of your genitals. Continue to hold your  labial skin folds open while collecting the sample: Urinate a small amount into the toilet, then stop the flow. Hold the collection container under your genital area. Urinate into the collection container until it is about half full, then stop. Set the collection container down. Let go of your labial folds and finish urinating into the toilet. Secure the lid onto the collection container. Avoid touching the inside of the lid and collection container. Wash your hands with soap and water for at least 20 seconds. Label the collection container as told. If you are at home, keep the sample refrigerated until you take it to the lab. Males Wash your hands with soap and water for at least 20 seconds. Place the cleansing wipes and collection container within reach. Open the collection container and place the lid with the flat side down. Be careful not to touch the inside of the lid. Clean your genital area: Take a cleansing wipe and clean all around the tip of your penis. Make sure the foreskin is pulled back away from the opening, if necessary. Throw the cleansing wipe away. Collect the sample: Urinate a small amount into the toilet, then stop the flow. Hold the collection container close to the opening of your penis. Urinate into the collection container until it is about half full, then stop. Set the collection container down. Finish urinating into the toilet. Secure the lid onto the collection container. Avoid touching the inside of the lid and collection container. Wash your hands with soap and water for at least 20 seconds. Label  the collection container as directed. If you are at home, keep the sample refrigerated until you take it to the lab. Infants Wash your hands with soap and water for at least 20 seconds. Open the collection bag. Wash your infant's genital area with cleansing wipes. Do not apply any ointments or antiseptics immediately before cleansing. Place the collection bag  over the penis or labia. Attach the adhesive to your infant's skin. Place a new diaper on your infant over the collection bag. Wash your hands with soap and water for at least 20 seconds. Monitor your baby. Remove the collection bag after your infant has urinated. You may need to transfer the urine into a collection container. Label the collection container as directed. If you are at home, keep the sample refrigerated until you take it to the lab. Summary The clean catch urine collection method is a way of collecting a urine sample for lab testing. Using a clean catch method reduces the chance that bacteria and other fluids from the genital area will be collected in the urine sample. The clean catch collection method varies slightly for women, men, and infants. This information is not intended to replace advice given to you by your health care provider. Make sure you discuss any questions you have with your health care provider. Document Revised: 10/04/2019 Document Reviewed: 08/03/2019 Elsevier Patient Education  2023 Elsevier Inc.   Tetanus; Diphtheria (Td) Vaccine Injection What is this medication? TETANUS; DIPHTHERIA VACCINE (TET n [place]; dif THEER ee uh vak SEEN) reduces the risk of tetanus (lockjaw) and diphtheria. It does not treat tetanus or diphtheria. It is still possible to get tetanus or diphtheria after receiving the vaccine, but the symptoms may be less severe or not last as long. It works by helping your immune system learn how to fight off a future infection. This medicine may be used for other purposes; ask your health care provider or pharmacist if you have questions. COMMON BRAND NAME(S): DECAVAC, TDVAX, TENIVAC What should I tell my care team before I take this medication? They need to know if you have any of these conditions: Bleeding disorder Immune system problems Infection with fever Low levels of platelets in the blood An unusual or allergic reaction to diphtheria or  tetanus toxoid, latex, other vaccines, other medications, foods, dyes, or preservatives Pregnant or trying to get pregnant Breastfeeding How should I use this medication? This vaccine is injected into a muscle. It is given by your care team. A copy of Vaccine Information Statements will be given before each vaccination. Be sure to read this information carefully each time. This sheet may change often. Talk to your care team about the use of this medication in children. While it may be prescribed to children for selected conditions, precautions do apply. Overdosage: If you think you have taken too much of this medicine contact a poison control center or emergency room at once. NOTE: This medicine is only for you. Do not share this medicine with others. What if I miss a dose? Keep appointments for follow-up doses as directed. It is important not to miss your dose. Call your care team if you are unable to keep an appointment. What may interact with this medication? Adalimumab [place]ra Certain medications that prevent or treat blood clots, such as daily aspirin, enoxaparin, heparin, ticlopidine, warfarin Infliximab Live virus vaccines Medications that lower your chance of fighting infection Medications to treat cancer Radiopharmaceuticals, such as iodine I-125 or I-131 This list may not describe all possible  interactions. Give your health care provider a list of all the medicines, herbs, non-prescription drugs, or dietary supplements you use. Also tell them if you smoke, drink alcohol, or use illegal drugs. Some items may interact with your medicine. What should I watch for while using this medication? Visit your care team regularly. Report any side effects to your care team right away. This vaccine, like all vaccines, may not fully protect everyone. What side effects may I notice from receiving this medication? Side effects that you should report to your care team as soon as  possible: Allergic reactions--skin rash, itching, hives, swelling of the face, lips, tongue, or throat Feeling faint or lightheaded Side effects that usually do not require medical attention (report these to your care team if they continue or are bothersome): Fever General discomfort and fatigue Headache Joint pain Loss of appetite Muscle weakness Pain, redness, or irritation at injection site This list may not describe all possible side effects. Call your doctor for medical advice about side effects. You may report side effects to FDA at 1-800-FDA-1088. Where should I keep my medication? This vaccine is only given by your care team. It will not be stored at home. NOTE: This sheet is a summary. It may not cover all possible information. If you have questions about this medicine, talk to your doctor, pharmacist, or health care provider.  2023 Elsevier/Gold Standard (2021-06-03 00:00:00) Influenza (Flu) Vaccine (Inactivated or Recombinant): What You Need to Know 1. Why get vaccinated? Influenza vaccine can prevent influenza (flu). Flu is a contagious disease that spreads around the [place] every year, usually between October and May. Anyone can get the flu, but it is more dangerous for some people. Infants and young children, people 53 years and older, pregnant people, and people with certain health conditions or a weakened immune system are at greatest risk of flu complications. Pneumonia, bronchitis, sinus infections, and ear infections are examples of flu-related complications. If you have a medical condition, such as heart disease, cancer, or diabetes, flu can make it worse. Flu can cause fever and chills, sore throat, muscle aches, fatigue, cough, headache, and runny or stuffy nose. Some people may have vomiting and diarrhea, though this is more common in children than adults. In an average year, thousands of people in the [place] die from flu, and many more are hospitalized.  Flu vaccine prevents millions of illnesses and flu-related visits to the doctor each year. 2. Influenza vaccines CDC recommends everyone 6 months and older get vaccinated every flu season. Children 6 months through 44 years of age may need 2 doses during a single flu season. Everyone else needs only 1 dose each flu season. It takes about 2 weeks for protection to develop after vaccination. There are many flu viruses, and they are always changing. Each year a new flu vaccine is made to protect against the influenza viruses believed to be likely to cause disease in the upcoming flu season. Even when the vaccine doesn't exactly match these viruses, it may still provide some protection. Influenza vaccine does not cause flu. Influenza vaccine may be given at the same time as other vaccines. 3. Talk with your health care provider Tell your vaccination provider if the person getting the vaccine: Has had an allergic reaction after a previous dose of influenza vaccine, or has any severe, life-threatening allergies Has ever had Guillain-Barr Syndrome (also called "GBS") In some cases, your health care provider may decide to postpone influenza vaccination until a future visit.  Influenza vaccine can be administered at any time during pregnancy. People who are or will be pregnant during influenza season should receive inactivated influenza vaccine. People with minor illnesses, such as a cold, may be vaccinated. People who are moderately or severely ill should usually wait until they recover before getting influenza vaccine. Your health care provider can give you more information. 4. Risks of a vaccine reaction Soreness, redness, and swelling where the shot is given, fever, muscle aches, and headache can happen after influenza vaccination. There may be a very small increased risk of Guillain-Barr Syndrome (GBS) after inactivated influenza vaccine (the flu shot). Young children who get the flu shot along with  pneumococcal vaccine (PCV13) and/or DTaP vaccine at the same time might be slightly more likely to have a seizure caused by fever. Tell your health care provider if a child who is getting flu vaccine has ever had a seizure. People sometimes faint after medical procedures, including vaccination. Tell your provider if you feel dizzy or have vision changes or ringing in the ears. As with any medicine, there is a very remote chance of a vaccine causing a severe allergic reaction, other serious injury, or death. 5. What if there is a serious problem? An allergic reaction could occur after the vaccinated person leaves the clinic. If you see signs of a severe allergic reaction (hives, swelling of the face and throat, difficulty breathing, a fast heartbeat, dizziness, or weakness), call 9-1-1 and get the person to the nearest hospital. For other signs that concern you, call your health care provider. Adverse reactions should be reported to the Vaccine Adverse Event Reporting System (VAERS). Your health care provider will usually file this report, or you can do it yourself. Visit the VAERS website at www.vaers.LAgents.no or call 434-884-7683. VAERS is only for reporting reactions, and VAERS staff members do not give medical advice. 6. The National Vaccine Injury Compensation Program The [employer] Vaccine Injury Compensation Program (VICP) is a federal program that was created to compensate people who may have been injured by certain vaccines. Claims regarding alleged injury or death due to vaccination have a time limit for filing, which may be as short as two years. Visit the VICP website at SpiritualWord.at or call (705)239-2783 to learn about the program and about filing a claim. 7. How can I learn more? Ask your health care provider. Call your local or state health department. Visit the website of the Food and Drug Administration (FDA) for vaccine package inserts and additional information at  FinderList.no. Contact the Centers for Disease Control and Prevention (CDC): Call 713-220-9815 (1-800-CDC-INFO) or Visit CDC's website at BiotechRoom.com.cy. Source: CDC Vaccine Information Statement Inactivated Influenza Vaccine (08/10/2019) This same material is available at FootballExhibition.com.br for no charge. This information is not intended to replace advice given to you by your health care provider. Make sure you discuss any questions you have with your health care provider. Document Revised: 11/18/2020 Document Reviewed: 09/11/2020 Elsevier Patient Education  2023 [employer].

## 2021-11-23 ENCOUNTER — Encounter: Payer: Self-pay | Admitting: Nurse Practitioner

## 2021-11-25 ENCOUNTER — Other Ambulatory Visit: Payer: Self-pay | Admitting: Nurse Practitioner

## 2021-11-25 MED ORDER — METRONIDAZOLE 500 MG PO TABS: 500.0000 mg | ORAL_TABLET | Freq: Three times a day (TID) | ORAL | 0 refills | Status: AC

## 2021-12-10 ENCOUNTER — Ambulatory Visit: Payer: 59 | Admitting: Nurse Practitioner

## 2022-06-16 ENCOUNTER — Ambulatory Visit (INDEPENDENT_AMBULATORY_CARE_PROVIDER_SITE_OTHER): Payer: 59 | Admitting: Nurse Practitioner

## 2022-06-16 ENCOUNTER — Encounter: Payer: Self-pay | Admitting: Nurse Practitioner

## 2022-06-16 VITALS — BP 120/80 | HR 79 | Temp 98.2°F | Ht 61.0 in | Wt 146.8 lb

## 2022-06-16 DIAGNOSIS — F419 Anxiety disorder, unspecified: Secondary | ICD-10-CM

## 2022-06-16 DIAGNOSIS — E7800 Pure hypercholesterolemia, unspecified: Secondary | ICD-10-CM | POA: Diagnosis not present

## 2022-06-16 DIAGNOSIS — E039 Hypothyroidism, unspecified: Secondary | ICD-10-CM | POA: Diagnosis not present

## 2022-06-16 DIAGNOSIS — M25561 Pain in right knee: Secondary | ICD-10-CM

## 2022-06-16 DIAGNOSIS — E663 Overweight: Secondary | ICD-10-CM

## 2022-06-16 DIAGNOSIS — Z6827 Body mass index (BMI) 27.0-27.9, adult: Secondary | ICD-10-CM

## 2022-06-16 DIAGNOSIS — Z0000 Encounter for general adult medical examination without abnormal findings: Secondary | ICD-10-CM | POA: Diagnosis not present

## 2022-06-16 DIAGNOSIS — Z2821 Immunization not carried out because of patient refusal: Secondary | ICD-10-CM

## 2022-06-16 MED ORDER — BUSPIRONE HCL 10 MG PO TABS: 10.0000 mg | ORAL_TABLET | Freq: Every day | ORAL | 1 refills | Status: DC

## 2022-06-16 MED ORDER — DICLOFENAC SODIUM 1 % EX GEL: 2.0000 g | Freq: Four times a day (QID) | CUTANEOUS | 2 refills | Status: AC

## 2022-06-16 NOTE — Patient Instructions (Signed)
If you can not get diclofenac gel you can get over the counter voltaren gel

## 2022-06-16 NOTE — Progress Notes (Signed)
I,Kvion Shapley,acting as a Neurosurgeon for Arnette Felts, FNP.,have documented all relevant documentation on the behalf of Arnette Felts, FNP,as directed by  Arnette Felts, FNP while in the presence of Arnette Felts, FNP.  Subjective:    Patient ID: [name] , female    DOB: 15-Nov-1975 , 47 y.o.   MRN: [id number]  Chief Complaint  Patient presents with   Annual Exam    HPI  Patient presents today for HM, patient reports compliance with medications and has no other concerns today. Patient denies any chest pain, SOB, or headaches. Patient reports having right knee pain for a couple of months.   Her anxiety has been more consistent since the beginning of the year. She has been taking the buspirone  BP Readings from Last 3 Encounters: 06/16/22 : 120/80 11/17/21 : 112/62 06/10/21 : 124/70    Knee Pain  The incident occurred more than 1 week ago (for couple months). There was no injury mechanism. The pain is present in the right knee. The pain is at a severity of 5/10. The pain has been Fluctuating since onset. She reports no foreign bodies present. The symptoms are aggravated by movement.     Past Medical History:  Diagnosis Date   Abnormal Pap smear    Anemia    Anxiety    CHF (congestive heart failure) (HCC)    History of congestive heart failure    with pulm edema, hyperthyroid dx   Hyperthyroidism    treated with radioactive iodine now hypothyroid   Hypothyroidism    IBS (irritable bowel syndrome)    Sickle cell trait (HCC)      Family History  Problem Relation Age of Onset   Arthritis Mother    Hypertension Mother    Sickle cell trait Mother    Diabetes Father    Hypertension Father    Heart attack Father    Sickle cell trait Brother    Cancer Maternal Aunt        breast   Cancer Maternal Grandmother        breast   Cancer Paternal Grandmother      Current Outpatient Medications:    desogestrel-ethinyl estradiol (MIRCETTE) 0.15-0.02/0.01 MG (21/5) tablet, Take  1 tablet by mouth daily., Disp: , Rfl:    diclofenac Sodium (VOLTAREN) 1 % GEL, Apply 2 g topically 4 (four) times daily., Disp: 100 g, Rfl: 2   Levothyroxine Sodium 88 MCG CAPS, Take 88 mcg by mouth daily before breakfast. , Disp: , Rfl:    busPIRone (BUSPAR) 10 MG tablet, Take 1 tablet (10 mg total) by mouth daily., Disp: 90 tablet, Rfl: 1   No Known Allergies    The patient states she uses OCP (estrogen/progesterone) for birth control. Patient's last menstrual period was 05/18/2022 (approximate).  Negative for Dysmenorrhea and Negative for Menorrhagia. Negative for: breast discharge, breast lump(s), breast pain and breast self exam. Associated symptoms include abnormal vaginal bleeding. Pertinent negatives include abnormal bleeding (hematology), anxiety, decreased libido, depression, difficulty falling sleep, dyspareunia, history of infertility, nocturia, sexual dysfunction, sleep disturbances, urinary incontinence, urinary urgency, vaginal discharge and vaginal itching. Diet regular.The patient states her exercise level is trying to get back active. She went once a week last week.   The patient's tobacco use is:  Social History   Tobacco Use  Smoking Status Never  Smokeless Tobacco Never   She has been exposed to passive smoke. The patient's alcohol use is:  Social History   Substance and Sexual Activity  Alcohol Use Yes   Alcohol/week: 1.0 standard drink of alcohol   Types: 1 Glasses of wine per week   Additional information: Last pap 11/27/2020, next one scheduled for 11/28/2023.    Review of Systems  Constitutional: Negative.   HENT: Negative.    Eyes: Negative.   Respiratory: Negative.    Cardiovascular: Negative.   Gastrointestinal: Negative.   Endocrine: Negative.   Genitourinary: Negative.   Musculoskeletal: Negative.   Skin: Negative.   Allergic/Immunologic: Negative.   Neurological: Negative.   Hematological: Negative.   Psychiatric/Behavioral: Negative.        Today's Vitals   06/16/22 1412  BP: 120/80  Pulse: 79  Temp: 98.2 F (36.8 C)  TempSrc: Oral  Weight: 146 lb 12.8 oz (66.6 kg)  Height: 5\' 1"  (1.549 m)  PainSc: 4   PainLoc: Knee   Body mass index is 27.74 kg/m.  Wt Readings from Last 3 Encounters:  06/16/22 146 lb 12.8 oz (66.6 kg)  11/17/21 142 lb (64.4 kg)  06/10/21 144 lb (65.3 kg)     Objective:  Physical Exam Vitals reviewed.  Constitutional:      General: She is not in acute distress.    Appearance: Normal appearance. She is well-developed. She is not ill-appearing.  HENT:     Head: Normocephalic and atraumatic.     Right Ear: Hearing, tympanic membrane, ear canal and external ear normal. There is no impacted cerumen.     Left Ear: Hearing, tympanic membrane, ear canal and external ear normal. There is no impacted cerumen.     Nose: Nose normal.     Mouth/Throat:     Mouth: Mucous membranes are moist.  Eyes:     General: Lids are normal.        Right eye: No hordeolum.        Left eye: No hordeolum.     Extraocular Movements: Extraocular movements intact.     Conjunctiva/sclera: Conjunctivae normal.     Pupils: Pupils are equal, round, and reactive to light.     Funduscopic exam:    Right eye: No papilledema.        Left eye: No papilledema.  Neck:     Thyroid: No thyroid mass.     Vascular: No carotid bruit.  Cardiovascular:     Rate and Rhythm: Normal rate and regular rhythm.     Pulses: Normal pulses.     Heart sounds: Normal heart sounds. No murmur heard. Pulmonary:     Effort: Pulmonary effort is normal. No respiratory distress.     Breath sounds: Normal breath sounds. No wheezing.  Chest:     Chest wall: No mass.  Breasts:    Tanner Score is 5.     Right: Normal. No mass or tenderness.     Left: Normal. No mass or tenderness.  Abdominal:     General: Abdomen is flat. Bowel sounds are normal. There is no distension.     Palpations: Abdomen is soft.     Tenderness: There is no abdominal  tenderness.  Genitourinary:    Comments: Deferred - followed by GYN Musculoskeletal:        General: No swelling. Normal range of motion.     Cervical back: Full passive range of motion without pain, normal range of motion and neck supple.     Right lower leg: Tenderness (with swelling noted to medial and lateral knee) present. No edema.     Left lower leg: No edema.  Comments: Negative drawer test  Lymphadenopathy:     Upper Body:     Right upper body: No supraclavicular, axillary or pectoral adenopathy.     Left upper body: No supraclavicular, axillary or pectoral adenopathy.  Skin:    General: Skin is warm and dry.     Capillary Refill: Capillary refill takes less than 2 seconds.     Findings: No rash.  Neurological:     General: No focal deficit present.     Mental Status: She is alert and oriented to person, place, and time.     Cranial Nerves: No cranial nerve deficit.     Sensory: No sensory deficit.     Motor: No weakness.  Psychiatric:        Mood and Affect: Mood normal.        Behavior: Behavior normal.        Thought Content: Thought content normal.        Judgment: Judgment normal.         Assessment And Plan:     1. Encounter for annual health examination Behavior modifications discussed and diet history reviewed.   Pt will continue to exercise regularly and modify diet with low GI, plant based foods and decrease intake of processed foods.  Recommend intake of daily multivitamin, Vitamin D, and calcium.  Recommend mammogram and colonoscopy for preventive screenings, as well as recommend immunizations that include influenza, TDAP  2. Acquired hypothyroidism Comments: Thyroid levels are nomral. Continue current medications and f/u with Dr. Talmage Nap - TSH + free T4  3. Elevated cholesterol Comments: Stable continue low fat diet. - CBC with Differential/Platelet - CMP14+EGFR - Lipid panel  4. Acute pain of right knee Comments: Her pain is limiting her from  performing activities. Will refer to orthopedics for further evaluation - DG Knee 1-2 Views Right; Future - diclofenac Sodium (VOLTAREN) 1 % GEL; Apply 2 g topically 4 (four) times daily.  Dispense: 100 g; Refill: 2 - Ambulatory referral to Orthopedic Surgery  5. Anxiety Comments: Stable. Continue buspirone - busPIRone (BUSPAR) 10 MG tablet; Take 1 tablet (10 mg total) by mouth daily.  Dispense: 90 tablet; Refill: 1  6. Overweight with body mass index (BMI) of 27 to 27.9 in adult  7. COVID-19 vaccination declined Declines covid 19 vaccine. Discussed risk of covid 49 and if she changes her mind about the vaccine to call the office. Education has been provided regarding the importance of this vaccine but patient still declined. Advised may receive this vaccine at local pharmacy or Health Dept.or vaccine clinic. Aware to provide a copy of the vaccination record if obtained from local pharmacy or Health Dept.  Encouraged to take multivitamin, vitamin d, vitamin c and zinc to increase immune system. Aware can call office if would like to have vaccine here at office. Verbalized acceptance and understanding.   Return for 1 year physical, 6 month thyroid check. Patient was given opportunity to ask questions. Patient verbalized understanding of the plan and was able to repeat key elements of the plan. All questions were answered to their satisfaction.   Arnette Felts, FNP  I, Arnette Felts, FNP, have reviewed all documentation for this visit. The documentation on 06/16/22 for the exam, diagnosis, procedures, and orders are all accurate and complete.

## 2022-06-17 LAB — CMP14+EGFR
ALT: 13 IU/L (ref 0–32)
AST: 14 IU/L (ref 0–40)
Albumin/Globulin Ratio: 1.5
Albumin: 4.7 g/dL (ref 3.9–4.9)
Alkaline Phosphatase: 62 IU/L (ref 44–121)
BUN/Creatinine Ratio: 8 — ABNORMAL LOW (ref 9–23)
BUN: 8 mg/dL (ref 6–24)
Bilirubin Total: 0.8 mg/dL (ref 0.0–1.2)
CO2: 25 mmol/L (ref 20–29)
Calcium: 9.5 mg/dL (ref 8.7–10.2)
Chloride: 103 mmol/L (ref 96–106)
Creatinine, Ser: 1 mg/dL (ref 0.57–1.00)
Globulin, Total: 3.1 g/dL (ref 1.5–4.5)
Glucose: 92 mg/dL (ref 70–99)
Potassium: 4.6 mmol/L (ref 3.5–5.2)
Sodium: 140 mmol/L (ref 134–144)
Total Protein: 7.8 g/dL (ref 6.0–8.5)
eGFR: 69 mL/min/{1.73_m2} (ref >59)

## 2022-06-17 LAB — CBC WITH DIFFERENTIAL/PLATELET
Basophils Absolute: 0.1 10*3/uL (ref 0.0–0.2)
Basos: 1 %
EOS (ABSOLUTE): 0.1 10*3/uL (ref 0.0–0.4)
Eos: 1 %
Hematocrit: 41 % (ref 34.0–46.6)
Hemoglobin: 13.3 g/dL (ref 11.1–15.9)
Immature Grans (Abs): 0 10*3/uL (ref 0.0–0.1)
Immature Granulocytes: 0 %
Lymphocytes Absolute: 2.7 10*3/uL (ref 0.7–3.1)
Lymphs: 39 %
MCH: 30.8 pg (ref 26.6–33.0)
MCHC: 32.4 g/dL (ref 31.5–35.7)
MCV: 95 fL (ref 79–97)
Monocytes Absolute: 0.6 10*3/uL (ref 0.1–0.9)
Monocytes: 9 %
Neutrophils Absolute: 3.5 10*3/uL (ref 1.4–7.0)
Neutrophils: 50 %
Platelets: 298 10*3/uL (ref 150–450)
RBC: 4.32 x10E6/uL (ref 3.77–5.28)
RDW: 11.9 % (ref 11.7–15.4)
WBC: 7 10*3/uL (ref 3.4–10.8)

## 2022-06-17 LAB — LIPID PANEL
Chol/HDL Ratio: 3.6 ratio (ref 0.0–4.4)
Cholesterol, Total: 223 mg/dL — ABNORMAL HIGH (ref 100–199)
HDL: 62 mg/dL (ref >39)
LDL Chol Calc (NIH): 123 mg/dL — ABNORMAL HIGH (ref 0–99)
Triglycerides: 218 mg/dL — ABNORMAL HIGH (ref 0–149)
VLDL Cholesterol Cal: 38 mg/dL (ref 5–40)

## 2022-06-17 LAB — TSH+FREE T4
Free T4: 1.08 ng/dL (ref 0.82–1.77)
TSH: 4.63 u[IU]/mL — ABNORMAL HIGH (ref 0.450–4.500)

## 2022-11-09 ENCOUNTER — Other Ambulatory Visit: Payer: Self-pay | Admitting: Nurse Practitioner

## 2022-11-09 DIAGNOSIS — F419 Anxiety disorder, unspecified: Secondary | ICD-10-CM

## 2022-12-16 ENCOUNTER — Ambulatory Visit: Payer: 59 | Admitting: Nurse Practitioner

## 2023-01-03 ENCOUNTER — Ambulatory Visit: Payer: 59 | Admitting: Nurse Practitioner

## 2023-01-03 ENCOUNTER — Encounter: Payer: Self-pay | Admitting: Nurse Practitioner

## 2023-01-03 VITALS — BP 130/80 | HR 98 | Temp 98.4°F | Ht 61.0 in | Wt 145.2 lb

## 2023-01-03 DIAGNOSIS — F419 Anxiety disorder, unspecified: Secondary | ICD-10-CM

## 2023-01-03 DIAGNOSIS — Z6827 Body mass index (BMI) 27.0-27.9, adult: Secondary | ICD-10-CM

## 2023-01-03 DIAGNOSIS — E663 Overweight: Secondary | ICD-10-CM

## 2023-01-03 DIAGNOSIS — E039 Hypothyroidism, unspecified: Secondary | ICD-10-CM | POA: Insufficient documentation

## 2023-01-03 DIAGNOSIS — Z2821 Immunization not carried out because of patient refusal: Secondary | ICD-10-CM | POA: Insufficient documentation

## 2023-01-03 NOTE — Assessment & Plan Note (Signed)
Influenza vaccine administered Encouraged to take Tylenol as needed for fever or muscle aches.

## 2023-01-03 NOTE — Assessment & Plan Note (Signed)
She was seen by Endocrinology earlier this month and had her levothyroxine increased to 88 mcg.

## 2023-01-03 NOTE — Assessment & Plan Note (Signed)

## 2023-01-03 NOTE — Progress Notes (Signed)
I,Jameka J Llittleton, CMA,acting as a Neurosurgeon for [employer], FNP.,have documented all relevant documentation on the behalf of Arnette Felts, FNP,as directed by  Arnette Felts, FNP while in the presence of Arnette Felts, FNP.  Subjective:  Patient ID: [name] , female    DOB: 01-13-1975 , 47 y.o.   MRN: [id number]  Chief Complaint  Patient presents with   Hypothyroidism    HPI  Patient presents today for a f/u on her thyroid. Patient reports compliance with her meds. She has recently switched to mirena (on November 5th) and Dr balan will follow her TSH. She is trying to get her menstrual cycle back regular.      Past Medical History:  Diagnosis Date   Abnormal Pap smear    Anemia    Anxiety    CHF (congestive heart failure) (HCC)    History of congestive heart failure    with pulm edema, hyperthyroid dx   Hyperthyroidism    treated with radioactive iodine now hypothyroid   Hypothyroidism    IBS (irritable bowel syndrome)    Sickle cell trait (HCC)      Family History  Problem Relation Age of Onset   Arthritis Mother    Hypertension Mother    Sickle cell trait Mother    Diabetes Father    Hypertension Father    Heart attack Father    Sickle cell trait Brother    Cancer Maternal Aunt        breast   Cancer Maternal Grandmother        breast   Cancer Paternal Grandmother      Current Outpatient Medications:    busPIRone (BUSPAR) 10 MG tablet, TAKE 1 TABLET BY MOUTH EVERY DAY, Disp: 90 tablet, Rfl: 2   diclofenac Sodium (VOLTAREN) 1 % GEL, Apply 2 g topically 4 (four) times daily., Disp: 100 g, Rfl: 2   levonorgestrel (MIRENA, 52 MG,) 20 MCG/DAY IUD, 1 each by Intrauterine route once., Disp: , Rfl:    Levothyroxine Sodium 88 MCG CAPS, Take 88 mcg by mouth daily before breakfast. , Disp: , Rfl:    No Known Allergies   Review of Systems  Constitutional: Negative.   Respiratory: Negative.    Cardiovascular: Negative.   Gastrointestinal: Negative.    Endocrine: Negative for polydipsia, polyphagia and polyuria.  Genitourinary: Negative.  Negative for decreased urine volume and frequency.  Musculoskeletal: Negative.   Skin: Negative.   Neurological: Negative.   Psychiatric/Behavioral: Negative.       Today's Vitals   01/03/23 1228  BP: 130/80  Pulse: 98  Temp: 98.4 F (36.9 C)  TempSrc: Oral  Weight: 145 lb 3.2 oz (65.9 kg)  Height: 5\' 1"  (1.549 m)  PainSc: 0-No pain   Body mass index is 27.44 kg/m.  Wt Readings from Last 3 Encounters:  01/03/23 145 lb 3.2 oz (65.9 kg)  06/16/22 146 lb 12.8 oz (66.6 kg)  11/17/21 142 lb (64.4 kg)     Objective:  Physical Exam Vitals reviewed.  Constitutional:      General: She is not in acute distress.    Appearance: Normal appearance. She is not ill-appearing.  Cardiovascular:     Rate and Rhythm: Normal rate and regular rhythm.     Pulses: Normal pulses.     Heart sounds: Normal heart sounds. No murmur heard. Pulmonary:     Effort: Pulmonary effort is normal. No respiratory distress.     Breath sounds: Normal breath sounds. No wheezing.  Skin:  General: Skin is warm and dry.     Findings: No rash.  Neurological:     General: No focal deficit present.     Mental Status: She is alert and oriented to person, place, and time.     Cranial Nerves: No cranial nerve deficit.     Motor: No weakness.  Psychiatric:        Mood and Affect: Mood normal.        Behavior: Behavior normal.        Thought Content: Thought content normal.        Judgment: Judgment normal.         Assessment And Plan:  Acquired hypothyroidism Assessment & Plan: She was seen by Endocrinology earlier this month and had her levothyroxine increased to 88 mcg.    Anxiety Assessment & Plan: She is doing well, continue buspirone.    COVID-19 vaccination declined Assessment & Plan: Declines covid 19 vaccine. Discussed risk of covid 37 and if she changes her mind about the vaccine to call the  office. Education has been provided regarding the importance of this vaccine but patient still declined. Advised may receive this vaccine at local pharmacy or Health Dept.or vaccine clinic. Aware to provide a copy of the vaccination record if obtained from local pharmacy or Health Dept.  Encouraged to take multivitamin, vitamin d, vitamin c and zinc to increase immune system. Aware can call office if would like to have vaccine here at office. Verbalized acceptance and understanding.    Influenza vaccination declined Assessment & Plan: Influenza vaccine administered Encouraged to take Tylenol as needed for fever or muscle aches.    Overweight with body mass index (BMI) of 27 to 27.9 in adult Assessment & Plan: Weight has decreased slightly since her last visit.     Return for keep same next..   Patient was given opportunity to ask questions. Patient verbalized understanding of the plan and was able to repeat key elements of the plan. All questions were answered to their satisfaction.    Jeanell Sparrow, FNP, have reviewed all documentation for this visit. The documentation on 01/03/23 for the exam, diagnosis, procedures, and orders are all accurate and complete.   IF YOU HAVE BEEN REFERRED TO A SPECIALIST, IT MAY TAKE 1-2 WEEKS TO SCHEDULE/PROCESS THE REFERRAL. IF YOU HAVE NOT HEARD FROM US/SPECIALIST IN TWO WEEKS, PLEASE GIVE [place] A CALL AT [phone].

## 2023-01-03 NOTE — Assessment & Plan Note (Signed)
She is doing well, continue buspirone.

## 2023-01-03 NOTE — Assessment & Plan Note (Signed)
Weight has decreased slightly since her last visit.

## 2023-06-17 ENCOUNTER — Other Ambulatory Visit: Payer: 59

## 2023-06-20 ENCOUNTER — Encounter: Payer: 59 | Admitting: Nurse Practitioner

## 2023-06-20 NOTE — Progress Notes (Deleted)
 [place], CMA,acting as a Neurosurgeon for Erica Epley, FNP.,have documented all relevant documentation on the behalf of Erica Epley, FNP,as directed by  Erica Epley, FNP while in the presence of Erica Epley, FNP.  Subjective:    Patient ID: [name] , female    DOB: Apr 14, 1975 , 48 y.o.   MRN: [id number]  No chief complaint on file.   HPI  HPI   Past Medical History:  Diagnosis Date   Abnormal Pap smear    Anemia    Anxiety    CHF (congestive heart failure) (HCC)    History of congestive heart failure    with pulm edema, hyperthyroid dx   Hyperthyroidism    treated with radioactive iodine now hypothyroid   Hypothyroidism    IBS (irritable bowel syndrome)    Sickle cell trait (HCC)      Family History  Problem Relation Age of Onset   Arthritis Mother    Hypertension Mother    Sickle cell trait Mother    Diabetes Father    Hypertension Father    Heart attack Father    Sickle cell trait Brother    Cancer Maternal Aunt        breast   Cancer Maternal Grandmother        breast   Cancer Paternal Grandmother      Current Outpatient Medications:    busPIRone  (BUSPAR ) 10 MG tablet, TAKE 1 TABLET BY MOUTH EVERY DAY, Disp: 90 tablet, Rfl: 2   diclofenac  Sodium (VOLTAREN ) 1 % GEL, Apply 2 g topically 4 (four) times daily., Disp: 100 g, Rfl: 2   levonorgestrel (MIRENA, 52 MG,) 20 MCG/DAY IUD, 1 each by Intrauterine route once., Disp: , Rfl:    Levothyroxine  Sodium 88 MCG CAPS, Take 88 mcg by mouth daily before breakfast. , Disp: , Rfl:    No Known Allergies    The patient states she uses {contraceptive methods:5051} for birth control. No LMP recorded. (Menstrual status: Irregular Periods).. {Dysmenorrhea-menorrhagia:21918}. Negative for: breast discharge, breast lump(s), breast pain and breast self exam. Associated symptoms include abnormal vaginal bleeding. Pertinent negatives include abnormal bleeding (hematology), anxiety, decreased libido, depression,  difficulty falling sleep, dyspareunia, history of infertility, nocturia, sexual dysfunction, sleep disturbances, urinary incontinence, urinary urgency, vaginal discharge and vaginal itching. Diet regular.The patient states her exercise level is    . The patient's tobacco use is:  Social History   Tobacco Use  Smoking Status Never  Smokeless Tobacco Never  . She has been exposed to passive smoke. The patient's alcohol use is:  Social History   Substance and Sexual Activity  Alcohol Use Yes   Alcohol/week: 1.0 standard drink of alcohol   Types: 1 Glasses of wine per week  . Additional information: Last pap ***, next one scheduled for ***.    Review of Systems   There were no vitals filed for this visit. There is no height or weight on file to calculate BMI.  Wt Readings from Last 3 Encounters:  01/03/23 145 lb 3.2 oz (65.9 kg)  06/16/22 146 lb 12.8 oz (66.6 kg)  11/17/21 142 lb (64.4 kg)     Objective:  Physical Exam      Assessment And Plan:     Encounter for annual health examination  Acquired hypothyroidism  Anxiety  Elevated cholesterol     No follow-ups on file. Patient was given opportunity to ask questions. Patient verbalized understanding of the plan and was able to repeat key elements of the  plan. All questions were answered to their satisfaction.   Erica Epley, FNP  I, Erica Epley, FNP, have reviewed all documentation for this visit. The documentation on 06/20/23 for the exam, diagnosis, procedures, and orders are all accurate and complete.
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
## 2023-08-18 ENCOUNTER — Ambulatory Visit (INDEPENDENT_AMBULATORY_CARE_PROVIDER_SITE_OTHER): Payer: Self-pay | Admitting: Nurse Practitioner

## 2023-08-18 ENCOUNTER — Other Ambulatory Visit: Payer: Self-pay

## 2023-08-18 ENCOUNTER — Emergency Department (HOSPITAL_BASED_OUTPATIENT_CLINIC_OR_DEPARTMENT_OTHER)
Admission: EM | Admit: 2023-08-18 | Discharge: 2023-08-19 | Disposition: A | Discharge status: Home / Self Care | Attending: Emergency Medicine | Admitting: Emergency Medicine

## 2023-08-18 VITALS — BP 120/70 | HR 60 | Temp 98.5°F | Ht 61.0 in | Wt 149.4 lb

## 2023-08-18 DIAGNOSIS — E039 Hypothyroidism, unspecified: Secondary | ICD-10-CM | POA: Insufficient documentation

## 2023-08-18 DIAGNOSIS — Z6828 Body mass index (BMI) 28.0-28.9, adult: Secondary | ICD-10-CM

## 2023-08-18 DIAGNOSIS — F419 Anxiety disorder, unspecified: Secondary | ICD-10-CM | POA: Diagnosis not present

## 2023-08-18 DIAGNOSIS — E782 Mixed hyperlipidemia: Secondary | ICD-10-CM | POA: Insufficient documentation

## 2023-08-18 DIAGNOSIS — Z79899 Other long term (current) drug therapy: Secondary | ICD-10-CM | POA: Diagnosis not present

## 2023-08-18 DIAGNOSIS — Z13228 Encounter for screening for other metabolic disorders: Secondary | ICD-10-CM

## 2023-08-18 DIAGNOSIS — R5383 Other fatigue: Secondary | ICD-10-CM | POA: Insufficient documentation

## 2023-08-18 DIAGNOSIS — R202 Paresthesia of skin: Secondary | ICD-10-CM | POA: Diagnosis not present

## 2023-08-18 DIAGNOSIS — Z0000 Encounter for general adult medical examination without abnormal findings: Secondary | ICD-10-CM | POA: Diagnosis not present

## 2023-08-18 DIAGNOSIS — Z2821 Immunization not carried out because of patient refusal: Secondary | ICD-10-CM

## 2023-08-18 DIAGNOSIS — Z139 Encounter for screening, unspecified: Secondary | ICD-10-CM

## 2023-08-18 DIAGNOSIS — E663 Overweight: Secondary | ICD-10-CM | POA: Insufficient documentation

## 2023-08-18 DIAGNOSIS — M79651 Pain in right thigh: Secondary | ICD-10-CM | POA: Diagnosis present

## 2023-08-18 DIAGNOSIS — M791 Myalgia, unspecified site: Secondary | ICD-10-CM | POA: Insufficient documentation

## 2023-08-18 LAB — CBC WITH DIFFERENTIAL/PLATELET
Abs Immature Granulocytes: 0.03 K/uL (ref 0.00–0.07)
Basophils Absolute: 0.1 K/uL (ref 0.0–0.1)
Basophils Relative: 1 %
Eosinophils Absolute: 0.1 K/uL (ref 0.0–0.5)
Eosinophils Relative: 1 %
HCT: 40.2 % (ref 36.0–46.0)
Hemoglobin: 13.1 g/dL (ref 12.0–15.0)
Immature Granulocytes: 0 %
Lymphocytes Relative: 37 %
Lymphs Abs: 3.4 K/uL (ref 0.7–4.0)
MCH: 31.5 pg (ref 26.0–34.0)
MCHC: 32.6 g/dL (ref 30.0–36.0)
MCV: 96.6 fL (ref 80.0–100.0)
Monocytes Absolute: 0.7 K/uL (ref 0.1–1.0)
Monocytes Relative: 7 %
Neutro Abs: 5 K/uL (ref 1.7–7.7)
Neutrophils Relative %: 54 %
Platelets: 360 K/uL (ref 150–400)
RBC: 4.16 MIL/uL (ref 3.87–5.11)
RDW: 12.5 % (ref 11.5–15.5)
WBC: 9.3 K/uL (ref 4.0–10.5)
nRBC: 0 % (ref 0.0–0.2)

## 2023-08-18 LAB — D-DIMER, QUANTITATIVE: D-Dimer, Quant: 0.37 ug{FEU}/mL (ref 0.00–0.50)

## 2023-08-18 LAB — TSH: TSH: 3.86 u[IU]/mL (ref 0.350–4.500)

## 2023-08-18 NOTE — Assessment & Plan Note (Signed)
Continue f/u with Endocrinology.

## 2023-08-18 NOTE — Patient Instructions (Signed)
 Health Maintenance  Topic Date Due   Hepatitis B Vaccine (1 of 3 - 19+ 3-dose series) Never done   COVID-19 Vaccine (4 - 2024-25 season) 09/03/2023*   Flu Shot  04/03/2024*   Pap with HPV screening  11/27/2025   Colon Cancer Screening  05/03/2030   DTaP/Tdap/Td vaccine (3 - Td or Tdap) 11/18/2031   Hepatitis C Screening  Completed   HIV Screening  Completed   HPV Vaccine  Aged Out   Meningitis B Vaccine  Aged Out  *Topic was postponed. The date shown is not the original due date.

## 2023-08-18 NOTE — ED Triage Notes (Signed)
 Pt POV reporting intermittent bilateral leg numbness/tingling how it feels when your leg is falling asleep. Denies anxiety, no chest pain/SOB, NAD noted at this time.

## 2023-08-18 NOTE — Assessment & Plan Note (Signed)
 Routine visit with no significant findings. Discussed menstrual cycle changes with lighter but regular periods despite IUD. - Perform routine physical examination. - Discuss menstrual cycle changes and IUD effectiveness. - Check labs including A1c and iron levels.

## 2023-08-18 NOTE — Assessment & Plan Note (Signed)
 Elevated cholesterol levels acknowledged, with dietary habits potentially contributing. - Encourage dietary modifications to manage cholesterol levels.

## 2023-08-18 NOTE — ED Provider Notes (Signed)
 [place] [facility] AT [facility] Provider Note   CSN: [id number] Arrival date & time: 08/18/23  2201     Patient presents with: No chief complaint on file.   [name] is a 48 y.o. female.   Patient with a history of hypothyroidism, anxiety, presents with intermittent pain to her right thigh for the past 1 day.  Denies fall or injury.  Pain comes and goes feels like a sharp aching pain behind her right thigh.  Associate with feeling of feeling like her feet are going to sleep but not quite.  Right side greater than left.  Denies any numbness or tingling currently.  Has intermittent pain behind her right eye that comes and goes.  Also with some intermittent tingling to her right hand but not her left hand.  No headache, neck pain or back pain.  Does have some back pain last week but has since resolved.  No history of chronic back problems.  No weakness in her leg.  No bowel or bladder incontinence.  No fever, chills, nausea, vomiting, chest pain or shortness of breath.  No history of chronic back pain problems.  No history of IV drug abuse or cancer.  She saw her PCP this morning but was not have any symptoms at the time.  States it feels like there is some aching behind her right thigh and sensation that her feet are going to sleep at times but not currently.  Denies pins-and-needles feeling currently.  Denies weakness.  The history is provided by the patient.       Prior to Admission medications   Medication Sig Start Date End Date Taking? Authorizing Provider  busPIRone  (BUSPAR ) 10 MG tablet TAKE 1 TABLET BY MOUTH EVERY DAY 11/09/22   Georgina Speaks, FNP  diclofenac  Sodium (VOLTAREN ) 1 % GEL Apply 2 g topically 4 (four) times daily. 06/16/22   Moore, Janece, FNP  levonorgestrel (MIRENA, 52 MG,) 20 MCG/DAY IUD 1 each by Intrauterine route once. 11/15/22   [provider]  Levothyroxine  Sodium 88 MCG CAPS Take 88 mcg by mouth daily before breakfast.      [provider]    Allergies: Patient has no known allergies.    Review of Systems  Constitutional:  Negative for activity change, appetite change and fever.  HENT:  Negative for congestion.   Respiratory:  Negative for cough, chest tightness and shortness of breath.   Cardiovascular:  Negative for chest pain.  Gastrointestinal:  Negative for abdominal pain, nausea and vomiting.  Genitourinary:  Negative for dysuria and hematuria.  Musculoskeletal:  Positive for arthralgias and myalgias. Negative for back pain.  Skin:  Negative for rash.  Neurological:  Positive for numbness. Negative for dizziness, weakness and headaches.    all other systems are negative except as noted in the HPI and PMH.   Updated Vital Signs BP (!) 157/90 (BP Location: Right Arm)   Pulse 64   Temp 98.2 F (36.8 C) (Oral)   Resp 16   Ht 5' 1 (1.549 m)   Wt 67.6 kg   LMP 07/26/2023   SpO2 100%   BMI 28.15 kg/m   Physical Exam Vitals and nursing note reviewed.  Constitutional:      General: She is not in acute distress.    Appearance: She is well-developed.  HENT:     Head: Normocephalic and atraumatic.     Mouth/Throat:     Pharynx: No oropharyngeal exudate.  Eyes:     Conjunctiva/sclera:  Conjunctivae normal.     Pupils: Pupils are equal, round, and reactive to light.  Neck:     Comments: No meningismus. Cardiovascular:     Rate and Rhythm: Normal rate and regular rhythm.     Heart sounds: Normal heart sounds. No murmur heard. Pulmonary:     Effort: Pulmonary effort is normal. No respiratory distress.     Breath sounds: Normal breath sounds.  Chest:     Chest wall: No tenderness.  Abdominal:     Palpations: Abdomen is soft.     Tenderness: There is no abdominal tenderness. There is no guarding or rebound.  Musculoskeletal:        General: No tenderness. Normal range of motion.     Cervical back: Normal range of motion and neck supple.     Comments: 5/5 strength in bilateral  lower extremities. Ankle plantar and dorsiflexion intact. Great toe extension intact bilaterally. +2 DP and PT pulses. +2 patellar reflexes bilaterally. Normal gait.    Skin:    General: Skin is warm.  Neurological:     Mental Status: She is alert and oriented to person, place, and time.     Cranial Nerves: No cranial nerve deficit.     Motor: No abnormal muscle tone.     Coordination: Coordination normal.     Comments:  5/5 strength throughout. CN 2-12 intact.Equal grip strength.   Psychiatric:        Behavior: Behavior normal.     (all labs ordered are listed, but only abnormal results are displayed) Labs Reviewed  CK - Abnormal; Notable for the following components:      Result Value   Total CK 32 (*)    All other components within normal limits  COMPREHENSIVE METABOLIC PANEL WITH GFR - Abnormal; Notable for the following components:   Glucose, Bld 107 (*)    Calcium  10.4 (*)    All other components within normal limits  CBC WITH DIFFERENTIAL/PLATELET  TSH  HCG, SERUM, QUALITATIVE  D-DIMER, QUANTITATIVE  MAGNESIUM  T4, FREE    EKG: None  Radiology: No results found.   Procedures   Medications Ordered in the ED - No data to display                                  Medical Decision Making Amount and/or Complexity of Data Reviewed Labs: ordered. Decision-making details documented in ED Course. Radiology: ordered and independent interpretation performed. Decision-making details documented in ED Course. ECG/medicine tests: ordered and independent interpretation performed. Decision-making details documented in ED Course.   Intermittent discomfort behind right thigh for the past 1 day.  Associated with tingling to her feet sensation that is not present currently.  Intact distal strength, sensation, pulses and reflexes.  Low concern for cord compression or cauda equina.  No asymmetry or leg swelling.  Doubt DVT.  Will check basic electrolytes including magnesium  and CK.  D-dimer is negative.  Doubt DVT.  Labs reassuring with normal electrolytes and magnesium and TSH. CK normal.  Potassium and magnesium are normal.  TSH is normal.  Free T4 is pending.  Patient denies any numbness currently but she does continue to have some aching pain to her posterior thigh.  Discussed blood clot seems unlikely with negative D-dimer but cannot be ruled out completely.  She declines follow-up for ultrasound.  May benefit from referral to neurology for consideration of EMG and possibly lumbar  spine MRI if symptoms persist.  No indication for emergent MRI tonight.  Low concern for cord compression or cauda equina.  Discussed oral hydration at home, anti-inflammatories, PCP follow-up.     Final diagnoses:  None    ED Discharge Orders     None          Gari Trovato, Garnette, MD 08/19/23 [phone]
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
## 2023-08-18 NOTE — Assessment & Plan Note (Signed)
 Reports unusual fatigue, possibly related to sleep schedule, thyroid  function, or low iron levels. Recent lifestyle changes noted. - Check labs including iron levels and thyroid  function tests.

## 2023-08-18 NOTE — Assessment & Plan Note (Signed)

## 2023-08-18 NOTE — Assessment & Plan Note (Signed)
 No current concerns with this at this time. Anxiety score 0

## 2023-08-19 LAB — MAGNESIUM: Magnesium: 2 mg/dL (ref 1.7–2.4)

## 2023-08-19 LAB — CBC WITH DIFFERENTIAL/PLATELET
Basophils Absolute: 0 x10E3/uL (ref 0.0–0.2)
Basos: 1 %
EOS (ABSOLUTE): 0.1 x10E3/uL (ref 0.0–0.4)
Eos: 1 %
Hematocrit: 43.3 % (ref 34.0–46.6)
Hemoglobin: 13.4 g/dL (ref 11.1–15.9)
Immature Grans (Abs): 0.1 x10E3/uL (ref 0.0–0.1)
Immature Granulocytes: 1 %
Lymphocytes Absolute: 2.5 x10E3/uL (ref 0.7–3.1)
Lymphs: 33 %
MCH: 30.9 pg (ref 26.6–33.0)
MCHC: 30.9 g/dL — ABNORMAL LOW (ref 31.5–35.7)
MCV: 100 fL — ABNORMAL HIGH (ref 79–97)
Monocytes Absolute: 0.5 x10E3/uL (ref 0.1–0.9)
Monocytes: 7 %
Neutrophils Absolute: 4.2 x10E3/uL (ref 1.4–7.0)
Neutrophils: 57 %
Platelets: 327 x10E3/uL (ref 150–450)
RBC: 4.33 x10E6/uL (ref 3.77–5.28)
RDW: 11.9 % (ref 11.7–15.4)
WBC: 7.4 x10E3/uL (ref 3.4–10.8)

## 2023-08-19 LAB — LIPID PANEL
Chol/HDL Ratio: 2.8 ratio (ref 0.0–4.4)
Cholesterol, Total: 212 mg/dL — ABNORMAL HIGH (ref 100–199)
HDL: 76 mg/dL (ref >39)
LDL Chol Calc (NIH): 123 mg/dL — ABNORMAL HIGH (ref 0–99)
Triglycerides: 71 mg/dL (ref 0–149)
VLDL Cholesterol Cal: 13 mg/dL (ref 5–40)

## 2023-08-19 LAB — HEMOGLOBIN A1C
Est. average glucose Bld gHb Est-mCnc: 103 mg/dL
Hgb A1c MFr Bld: 5.2 % (ref 4.8–5.6)

## 2023-08-19 LAB — CMP14+EGFR
ALT: 16 IU/L (ref 0–32)
AST: 16 IU/L (ref 0–40)
Albumin: 4.5 g/dL (ref 3.9–4.9)
Alkaline Phosphatase: 76 IU/L (ref 44–121)
BUN/Creatinine Ratio: 9 (ref 9–23)
BUN: 9 mg/dL (ref 6–24)
Bilirubin Total: 0.9 mg/dL (ref 0.0–1.2)
CO2: 24 mmol/L (ref 20–29)
Calcium: 9.6 mg/dL (ref 8.7–10.2)
Chloride: 102 mmol/L (ref 96–106)
Creatinine, Ser: 0.97 mg/dL (ref 0.57–1.00)
Globulin, Total: 3.1 g/dL (ref 1.5–4.5)
Glucose: 78 mg/dL (ref 70–99)
Potassium: 4.7 mmol/L (ref 3.5–5.2)
Sodium: 140 mmol/L (ref 134–144)
Total Protein: 7.6 g/dL (ref 6.0–8.5)
eGFR: 72 mL/min/1.73 (ref >59)

## 2023-08-19 LAB — HEPATITIS B SURFACE ANTIBODY,QUALITATIVE: Hep B Surface Ab, Qual: NONREACTIVE

## 2023-08-19 LAB — VITAMIN D 25 HYDROXY (VIT D DEFICIENCY, FRACTURES): Vit D, 25-Hydroxy: 16.4 ng/mL — ABNORMAL LOW (ref 30.0–100.0)

## 2023-08-19 LAB — CK: Total CK: 32 U/L — ABNORMAL LOW (ref 38–234)

## 2023-08-19 LAB — COMPREHENSIVE METABOLIC PANEL WITH GFR
ALT: 17 U/L (ref 0–44)
AST: 17 U/L (ref 15–41)
Albumin: 4.6 g/dL (ref 3.5–5.0)
Alkaline Phosphatase: 95 U/L (ref 38–126)
Anion gap: 11 (ref 5–15)
BUN: 7 mg/dL (ref 6–20)
CO2: 26 mmol/L (ref 22–32)
Calcium: 10.4 mg/dL — ABNORMAL HIGH (ref 8.9–10.3)
Chloride: 102 mmol/L (ref 98–111)
Creatinine, Ser: 0.91 mg/dL (ref 0.44–1.00)
GFR, Estimated: >60 mL/min (ref >60)
Glucose, Bld: 107 mg/dL — ABNORMAL HIGH (ref 70–99)
Potassium: 3.8 mmol/L (ref 3.5–5.1)
Sodium: 139 mmol/L (ref 135–145)
Total Bilirubin: 0.8 mg/dL (ref 0.0–1.2)
Total Protein: 8 g/dL (ref 6.5–8.1)

## 2023-08-19 LAB — IRON,TIBC AND FERRITIN PANEL
Ferritin: 120 ng/mL (ref 15–150)
Iron Saturation: 18 % (ref 15–55)
Iron: 62 ug/dL (ref 27–159)
Total Iron Binding Capacity: 340 ug/dL (ref 250–450)
UIBC: 278 ug/dL (ref 131–425)

## 2023-08-19 LAB — T4, FREE: Free T4: 0.84 ng/dL (ref 0.61–1.12)

## 2023-08-19 LAB — TSH+FREE T4
Free T4: 1.18 ng/dL (ref 0.82–1.77)
TSH: 3.14 u[IU]/mL (ref 0.450–4.500)

## 2023-08-19 LAB — HCG, SERUM, QUALITATIVE: Preg, Serum: NEGATIVE

## 2023-08-19 NOTE — Discharge Instructions (Signed)
 Your blood work is reassuring.  Keep yourself hydrated.  Low concern for blood clot at this time.  Follow-up with your doctor and neurologist for consideration of further testing.  Use anti-inflammatories such as Motrin  or naproxen as needed for aches.  Return to the ED if worsening pain, weakness, numbness, tingling, bowel or bladder incontinence or other concerns.

## 2023-08-21 ENCOUNTER — Ambulatory Visit: Payer: Self-pay | Admitting: Nurse Practitioner

## 2023-08-22 NOTE — Telephone Encounter (Signed)
 Yes and she needs an ER f/u

## 2023-08-23 ENCOUNTER — Other Ambulatory Visit: Payer: Self-pay

## 2023-08-23 MED ORDER — VITAMIN D (ERGOCALCIFEROL) 1.25 MG (50000 UNIT) PO CAPS: 50000.0000 [IU] | ORAL_CAPSULE | ORAL | 0 refills | Status: DC

## 2023-08-30 ENCOUNTER — Other Ambulatory Visit: Payer: Self-pay | Admitting: Nurse Practitioner

## 2023-08-30 DIAGNOSIS — F419 Anxiety disorder, unspecified: Secondary | ICD-10-CM

## 2023-09-23 ENCOUNTER — Ambulatory Visit

## 2023-11-17 ENCOUNTER — Other Ambulatory Visit: Payer: Self-pay | Admitting: Nurse Practitioner

## 2023-12-08 ENCOUNTER — Ambulatory Visit: Admitting: Neurology

## 2023-12-08 ENCOUNTER — Encounter: Payer: Self-pay | Admitting: Neurology

## 2023-12-08 VITALS — BP 117/82 | HR 84 | Ht 61.0 in | Wt 152.5 lb

## 2023-12-08 DIAGNOSIS — R202 Paresthesia of skin: Secondary | ICD-10-CM | POA: Diagnosis not present

## 2023-12-08 DIAGNOSIS — M79609 Pain in unspecified limb: Secondary | ICD-10-CM | POA: Diagnosis not present

## 2023-12-08 NOTE — Progress Notes (Signed)
 Chief Complaint  Patient presents with   New Patient (Initial Visit)    Pt in room 14. Alone. Internal referral for paresthesias       ASSESSMENT AND PLAN  [name] is a 48 y.o. 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   MEDICAL HISTORY:  [name], is a 48 year old female seen in request by her primary care NP   Georgina Speaks, for evaluation of transient right lateral thigh paresthesia  History is obtained from the patient and review of electronic medical records. I personally reviewed pertinent available imaging films in PACS.   PMHx of  Hypothyroidism Sickle cell trait Anxiety  In August 2025, no clear triggers, she had few weeks history of intermittent right lateral thigh area numbness, sometimes burning pain, now her symptoms has improved, she denied persistent motor or sensory deficit denied bowel and bladder incontinence  Lab in August 2025 normal T4, CPK, TSH, CBC, A1c, CMP,  PHYSICAL EXAM:   Vitals:   12/08/23 1505  BP: 117/82  Pulse: 84  SpO2: 99%  Weight: 152 lb 8 oz (69.2 kg)  Height: 5' 1 (1.549 m)   Not recorded     Body mass index is 28.81 kg/m.  PHYSICAL EXAMNIATION:  Gen: NAD, conversant, well nourised, well groomed                     Cardiovascular: Regular rate rhythm, no peripheral edema, warm, nontender. Eyes: Conjunctivae clear without exudates or hemorrhage Neck: Supple, no carotid bruits. Pulmonary: Clear to auscultation bilaterally   NEUROLOGICAL EXAM:  MENTAL STATUS: Speech/cognition: Awake, alert, oriented to history taking and casual conversation CRANIAL NERVES: CN II: Visual fields are full to confrontation. Pupils are round equal and briskly reactive to  light. CN III, IV, VI: extraocular movement are normal. No ptosis. CN V: Facial sensation is intact to light touch CN VII: Face is symmetric with normal eye closure  CN VIII: Hearing is normal to causal conversation. CN IX, X: Phonation is normal. CN XI: Head turning and shoulder shrug are intact  MOTOR: There is no pronator drift of out-stretched arms. Muscle bulk and tone are normal. Muscle strength is normal.  REFLEXES: Reflexes are 2 and symmetric at the biceps, triceps, knees, and ankles. Plantar responses are flexor.  SENSORY: Intact to light touch, pinprick and vibratory sensation are intact in fingers and toes.  COORDINATION: There is no trunk or limb dysmetria noted.  GAIT/STANCE: Posture is normal. Gait is steady  REVIEW OF SYSTEMS:  Full 14 system review of systems performed and notable only for as above All other review of systems were negative.   ALLERGIES: No Known Allergies  HOME MEDICATIONS: Current Outpatient Medications  Medication Sig Dispense Refill   busPIRone  (BUSPAR ) 10 MG tablet TAKE 1 TABLET BY MOUTH EVERY DAY 30 tablet 8   diclofenac  Sodium (VOLTAREN ) 1 % GEL Apply 2 g topically 4 (four) times daily. (Patient taking differently: Apply 2 g topically 4 (four) times daily. As needed) 100 g 2   levonorgestrel (MIRENA, 52 MG,) 20 MCG/DAY IUD 1 each by Intrauterine route once.     Levothyroxine  Sodium 88 MCG CAPS Take 88 mcg by mouth daily before breakfast.      Vitamin D , Ergocalciferol , (DRISDOL ) 1.25 MG (  50000 UNIT) CAPS capsule TAKE 1 CAPSULE (50,000 UNITS TOTAL) BY MOUTH EVERY 7 (SEVEN) DAYS 4 capsule 2   No current facility-administered medications for this visit.    PAST MEDICAL HISTORY: Past Medical History:  Diagnosis Date   Abnormal Pap smear    Anemia    Anxiety    CHF (congestive heart failure) (HCC)    History of congestive heart failure    with pulm edema, hyperthyroid dx   Hyperthyroidism    treated with radioactive iodine now  hypothyroid   Hypothyroidism    IBS (irritable bowel syndrome)    Sickle cell trait     PAST SURGICAL HISTORY: Past Surgical History:  Procedure Laterality Date   CESAREAN SECTION     DILATION AND CURETTAGE OF UTERUS     GYNECOLOGIC CRYOSURGERY      FAMILY HISTORY: Family History  Problem Relation Age of Onset   Arthritis Mother    Hypertension Mother    Sickle cell trait Mother    Diabetes Father    Hypertension Father    Heart attack Father    Sickle cell trait Brother    Cancer Maternal Aunt        breast   Cancer Maternal Grandmother        breast   Cancer Paternal Grandmother     SOCIAL HISTORY: Social History   Socioeconomic History   Marital status: Married    Spouse name: Not on file   Number of children: Not on file   Years of education: Not on file   Highest education level: Bachelor's degree (e.g., BA, AB, BS)  Occupational History   Not on file  Tobacco Use   Smoking status: Never   Smokeless tobacco: Never  Vaping Use   Vaping status: Never Used  Substance and Sexual Activity   Alcohol use: Yes    Alcohol/week: 1.0 standard drink of alcohol    Types: 1 Glasses of wine per week   Drug use: No   Sexual activity: Yes    Birth control/protection: I.U.D.  Other Topics Concern   Not on file  Social History Narrative   Not on file   Social Drivers of Health   Financial Resource Strain: Low Risk  (08/18/2023)   Overall Financial Resource Strain (CARDIA)    Difficulty of Paying Living Expenses: Not very hard  Food Insecurity: No Food Insecurity (08/18/2023)   Hunger Vital Sign    Worried About Running Out of Food in the Last Year: Never true    Ran Out of Food in the Last Year: Never true  Transportation Needs: No Transportation Needs (08/18/2023)   PRAPARE - Administrator, Civil Service (Medical): No    Lack of Transportation (Non-Medical): No  Physical Activity: Inactive (08/18/2023)   Exercise Vital Sign    Days of Exercise per  Week: 0 days    Minutes of Exercise per Session: Not on file  Stress: Stress Concern Present (08/18/2023)   [employer] of Occupational Health - Occupational Stress Questionnaire    Feeling of Stress: To some extent  Social Connections: Moderately Integrated (08/18/2023)   Social Connection and Isolation Panel    Frequency of Communication with Friends and Family: More than three times a week    Frequency of Social Gatherings with Friends and Family: Once a week    Attends Religious Services: Never    Database Administrator or Organizations: Yes    Attends Banker Meetings: Never  Marital Status: Married  Catering Manager Violence: Not on file      Modena Callander, M.D. Ph.D.  [facility] [address] [place], [place] [address] Ph: [phone] Fax: [phone]  CC:  Carita Senior, MD [address] [place],  [place] [address]  Georgina Speaks, FNP

## 2023-12-09 ENCOUNTER — Ambulatory Visit: Admitting: Neurology

## 2024-02-27 ENCOUNTER — Ambulatory Visit: Payer: Self-pay | Admitting: Nurse Practitioner

## 2024-08-27 ENCOUNTER — Encounter: Payer: Self-pay | Admitting: Nurse Practitioner
